# Patient Record
Sex: Female | Born: 1975 | Race: Black or African American | Hispanic: No | Marital: Single | Smoking: Never smoker
Health system: Southern US, Community
[De-identification: ages and names within clinical notes are randomized; demographics above are authoritative.]

## PROBLEM LIST (undated history)

## (undated) DIAGNOSIS — K219 Gastro-esophageal reflux disease without esophagitis: Secondary | ICD-10-CM

## (undated) DIAGNOSIS — I10 Essential (primary) hypertension: Secondary | ICD-10-CM

## (undated) HISTORY — DX: Essential (primary) hypertension: I10

## (undated) HISTORY — DX: Gastro-esophageal reflux disease without esophagitis: K21.9

---

## 2013-07-09 HISTORY — PX: LEEP: SHX91

## 2013-09-21 LAB — HM PAP SMEAR: HM Pap smear: ABNORMAL

## 2014-06-01 ENCOUNTER — Ambulatory Visit: Payer: Self-pay | Admitting: Obstetrics & Gynecology

## 2014-06-02 ENCOUNTER — Ambulatory Visit: Payer: Self-pay | Admitting: Obstetrics & Gynecology

## 2014-06-02 LAB — WBC: WBC: 7.4 10*3/uL (ref 3.6–11.0)

## 2014-06-02 LAB — HEMOGLOBIN: HGB: 14.2 g/dL (ref 12.0–16.0)

## 2014-06-10 ENCOUNTER — Ambulatory Visit: Payer: Self-pay | Admitting: Obstetrics & Gynecology

## 2014-09-07 HISTORY — PX: UPPER GI ENDOSCOPY: SHX6162

## 2014-09-15 ENCOUNTER — Ambulatory Visit: Payer: Self-pay | Admitting: Gastroenterology

## 2014-09-22 ENCOUNTER — Ambulatory Visit (INDEPENDENT_AMBULATORY_CARE_PROVIDER_SITE_OTHER): Payer: 59 | Admitting: Nurse Practitioner

## 2014-09-22 ENCOUNTER — Encounter (INDEPENDENT_AMBULATORY_CARE_PROVIDER_SITE_OTHER): Payer: Self-pay

## 2014-09-22 ENCOUNTER — Encounter: Payer: Self-pay | Admitting: Nurse Practitioner

## 2014-09-22 VITALS — BP 140/100 | HR 93 | Temp 99.2°F | Resp 14 | Ht 62.0 in | Wt 140.0 lb

## 2014-09-22 DIAGNOSIS — H8113 Benign paroxysmal vertigo, bilateral: Secondary | ICD-10-CM

## 2014-09-22 DIAGNOSIS — Z7689 Persons encountering health services in other specified circumstances: Secondary | ICD-10-CM

## 2014-09-22 DIAGNOSIS — Z7189 Other specified counseling: Secondary | ICD-10-CM

## 2014-09-22 DIAGNOSIS — K219 Gastro-esophageal reflux disease without esophagitis: Secondary | ICD-10-CM

## 2014-09-22 DIAGNOSIS — F411 Generalized anxiety disorder: Secondary | ICD-10-CM

## 2014-09-22 NOTE — Progress Notes (Signed)
Pre visit review using our clinic review tool, if applicable. No additional management support is needed unless otherwise documented below in the visit note. 

## 2014-09-22 NOTE — Progress Notes (Signed)
Subjective:    Patient ID: Margaret May, female    DOB: 07/05/1976, 39 y.o.   MRN: [id number]  HPI  [name] is a 39 yo female establishing care today. She has a CC of dizziness and esophageal burning.   1) Health Maintenance-   Diet- Eats out a lot, watches what she eats   Exercise- Treadmill for 20 min 3 days a week   Immunizations- Tdap- 5-6 years ago   Pap- [place], abnormal- went to [facility] Dr. Elesa Massed  Eye Exam- UTD  Dental Exam- UTD  2) Chronic Problems-  [facility]- 120/90 medication in the past   GERD- Nexium 1 when needed   3) Acute Problems-  Burning in throat when eating certain stuff. Last 2 months, seeing GI specialist, drinking more water   Dizziness like the room is spinning when moving from lying to standing quickly or moving head quickly.   Recent labs:  03/03/2014  TC 137  HDL 75  LDL 75  Triglycerides 46   Glucose 84  5.5 a1c  Review of Systems  Constitutional: Negative for fever, chills, diaphoresis and fatigue.  HENT: Negative for tinnitus and trouble swallowing.   Eyes: Negative for visual disturbance.  Respiratory: Negative for chest tightness, shortness of breath and wheezing.   Cardiovascular: Negative for chest pain, palpitations and leg swelling.  Gastrointestinal: Positive for constipation. Negative for nausea, vomiting and diarrhea.       Intermittently  Endocrine: Negative for polydipsia, polyphagia and polyuria.  Genitourinary: Negative for dysuria.  Skin: Negative for rash.  Neurological: Positive for dizziness. Negative for weakness, numbness and headaches.  Psychiatric/Behavioral: Positive for sleep disturbance. Negative for suicidal ideas. The patient is nervous/anxious.    Past Medical History  Diagnosis Date  . GERD (gastroesophageal reflux disease)   . Hypertension     History   Social History  . Marital Status: Single    Spouse Name: N/A  . Number of Children: N/A  . Years of  Education: N/A   Occupational History  . Not on file.   Social History Main Topics  . Smoking status: Never Smoker   . Smokeless tobacco: Never Used  . Alcohol Use: No  . Drug Use: No  . Sexual Activity: Not Currently   Other Topics Concern  . 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 Upper gi endoscopy  09/2014  . Leep  2015    Family History  Problem Relation Age of Onset  . Hypertension Mother   . Kidney disease Maternal Uncle   . Diabetes Paternal Aunt   . Arthritis Maternal Grandmother   . Hypertension Maternal Grandmother     No Known Allergies  No current outpatient prescriptions on file prior to visit.   No current facility-administered medications on file prior to visit.      Objective:   Physical Exam  Constitutional: She is oriented to person, place, and time. She appears well-developed and well-nourished. No distress.  BP 140/100 mmHg  Pulse 93  Temp(Src) 99.2 F (37.3 C) (Oral)  Resp 14  Ht  (1.575 m)  Wt 140 lb (63.504 kg)  BMI 25.60 kg/m2  SpO2  97%  LMP  (Approximate) Repeat BP 126/92, temp- 98.2   HENT:  Head: Normocephalic and atraumatic.  Right Ear: External ear normal.  Left Ear: External ear normal.  Eyes: Right eye exhibits no discharge. Left eye exhibits no discharge. No scleral icterus.  Neck: Normal range of motion. Neck supple.  Cardiovascular: Normal rate, regular rhythm, normal heart sounds and intact distal pulses.  Exam reveals no gallop and no friction rub.   No murmur heard. Pulmonary/Chest: Effort normal and breath sounds normal. No respiratory distress. She has no wheezes. She has no rales. She exhibits no tenderness.  Neurological: She is alert and oriented to person, place, and  time. No cranial nerve deficit. She exhibits normal muscle tone. Coordination normal.  Skin: Skin is warm and dry. No rash noted. She is not diaphoretic.  Psychiatric: She has a normal mood and affect. Her behavior is normal. Judgment and thought content normal.  Very nervous at the beginning and states it is the white coat. After removing the coat and talking to her for awhile she was much more at ease and vital signs improved.       Assessment & Plan:

## 2014-09-22 NOTE — Patient Instructions (Signed)
Welcome to [facility]! 

## 2014-09-26 DIAGNOSIS — H8110 Benign paroxysmal vertigo, unspecified ear: Secondary | ICD-10-CM | POA: Insufficient documentation

## 2014-09-26 DIAGNOSIS — Z7689 Persons encountering health services in other specified circumstances: Secondary | ICD-10-CM | POA: Insufficient documentation

## 2014-09-26 DIAGNOSIS — K219 Gastro-esophageal reflux disease without esophagitis: Secondary | ICD-10-CM | POA: Insufficient documentation

## 2014-09-26 DIAGNOSIS — F411 Generalized anxiety disorder: Secondary | ICD-10-CM | POA: Insufficient documentation

## 2014-09-26 NOTE — Assessment & Plan Note (Signed)
Very anxious about medical visits. Responded well to removing white coat. Will keep this in mind for future visits. On diazepam.

## 2014-09-26 NOTE — Assessment & Plan Note (Signed)
Asked pt to make sure she is slowly changing positions and to not turn head quickly if she can be mindful of this.

## 2014-09-26 NOTE — Assessment & Plan Note (Signed)
Discussed acute and chronic issues. Reviewed health maintenance measures, PFSHx, and immunizations.   

## 2014-09-26 NOTE — Assessment & Plan Note (Signed)
Seeing GI for possible endoscopy. Nexium helpful. Drinking more water. Continue these measures and fu with GI.

## 2014-10-30 NOTE — Op Note (Signed)
PATIENT NAME:  [name], Margaret A MR#:  [id number] DATE OF BIRTH:  February 05, 1976  DATE OF PROCEDURE:  06/10/2014  PREOPERATIVE DIAGNOSIS:  Cervical dysplasia.    POSTOPERATIVE DIAGNOSIS:  Cervical dysplasia.    PROCEDURE:  Loop electrosurgical excision procedure cervical biopsy.    SURGEON:  Ranae Plumberhelsea Batul Diego, May.    ASSISTANT:  None.    ANESTHESIA:  General/LMA.    INTRAVENOUS FLUIDS IN:  800 mL lactated Ringer's.    URINE OUTPUT:  0.    ESTIMATED BLOOD LOSS:  5 mL.    SPECIMENS:   1.  Loop cervical biopsy.   2.  Endocervical curettings.    FINDINGS:  Normal-appearing cervix.    COMPLICATIONS:  None apparent.    DISPOSITION:  Stable to PACU for recovery.    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  Due to the patient's intolerance of the colposcopy she was brought to the OR for a cervical biopsy.    OPERATIVE NOTE:  [name], brought back to the operating room where she was given general anesthesia via LMA.  She was placed in the dorsal lithotomy position with candy cane stirrups and vaginally prepped with Betadine.  A surgical timeout was called.  She was prepped and draped in the usual sterile manner and a cautery-safe Peterson speculum was placed into the vagina.  The cervix was visualized and bathed in Lugol solution.  At the time of procedure there were no areas of absent uptake of Lugol solution.  There were no areas identified at 5 o'clock as was previously noted on the abandoned colposcopy.  A small shallow electrocautery loop was selected and with cut at 60 and cautery at 1. The loop tip was entered just posterior to the cervical os, carried anterior for a 1 x 1 cm excision biopsy with the center being the cervical os.  This was tagged at 12 o'clock and handed off to nursing for it to be sent  to pathology.  Following this gentle endocervical curettage was performed and endocervical cells were handed off to nursing to be sent to pathology.  The roller ball cautery tip was then used with 40-40 cut/cautery to cauterize the excision area.  There was no bleeding noted and at the end of the procedure 10 mL of lidocaine, 5 mL at 4 o'clock and 5 mL at 8 o'clock were injected into the paracervical area for decreased uterine cramping and patient comfort following the procedure.  That was the end of the procedure.  All instruments were removed, the counts were correct x 2, and the patient was brought to PACU in stable condition for recovery.     ____________________________ Margaret Fenderhelsea C. Daton Szilagyi, May ccw:bu D: 06/10/2014 09:27:02 ET T: 06/10/2014 14:06:17 ET JOB#: [id number]  cc: Margaret Bockhelsea C. Remijio Holleran, May, <Dictator> Margaret May ELECTRONICALLY SIGNED 06/10/2014 18:18

## 2014-11-01 LAB — SURGICAL PATHOLOGY

## 2014-12-01 ENCOUNTER — Ambulatory Visit (INDEPENDENT_AMBULATORY_CARE_PROVIDER_SITE_OTHER): Payer: 59 | Admitting: Nurse Practitioner

## 2014-12-01 VITALS — BP 142/100 | HR 85 | Temp 98.4°F | Resp 16 | Ht 62.0 in | Wt 135.4 lb

## 2014-12-01 DIAGNOSIS — R350 Frequency of micturition: Secondary | ICD-10-CM

## 2014-12-01 LAB — POCT URINALYSIS DIPSTICK
Bilirubin, UA: NEGATIVE
Glucose, UA: NEGATIVE
KETONES UA: NEGATIVE
Leukocytes, UA: NEGATIVE
NITRITE UA: NEGATIVE
Protein, UA: NEGATIVE
Spec Grav, UA: 1.01
UROBILINOGEN UA: 0.2
pH, UA: 6

## 2014-12-01 MED ORDER — AMLODIPINE BESYLATE 5 MG PO TABS: 5.0000 mg | ORAL_TABLET | Freq: Every day | ORAL | Status: DC

## 2014-12-01 NOTE — Patient Instructions (Signed)
Try Advil and/or AZO over the counter   Follow up in 1 month.

## 2014-12-01 NOTE — Progress Notes (Signed)
   Subjective:    Patient ID: Margaret May, female    DOB: 02/08/1976, 39 y.o.   MRN: [id number]  HPI  Margaret May is a 39 yo female with a CC of urinary frequency and discomfort of lower abdomen/pelvis x 3 days.   1) Urinating, drinking more water than usual   Coke and tea mainly previously Denies odor or discharge   Achy L>R-  Next period due 12/20/14   Advil- helped with sinus pressure this weekend  Review of Systems  Constitutional: Negative for fever, chills, diaphoresis and fatigue.  Gastrointestinal: Negative for nausea and vomiting.  Genitourinary: Positive for frequency and pelvic pain. Negative for dysuria, urgency, hematuria, flank pain, decreased urine volume, vaginal bleeding, vaginal discharge and difficulty urinating.  Skin: Negative for rash.      Objective:   Physical Exam  Constitutional: She is oriented to person, place, and time. She appears well-developed and well-nourished. No distress.  BP 142/100 mmHg  Pulse 85  Temp(Src) 98.4 F (36.9 C)  Resp 16  Ht 5\' 2"  (1.575 m)  Wt 135 lb 6.4 oz (61.417 kg)  BMI 24.76 kg/m2  SpO2 99%   HENT:  Head: Normocephalic and atraumatic.  Right Ear: External ear normal.  Left Ear: External ear normal.  Cardiovascular: Normal rate, regular rhythm and normal heart sounds.  Exam reveals no gallop and no friction rub.   No murmur heard. Pulmonary/Chest: Effort normal and breath sounds normal. No respiratory distress. She has no wheezes. She has no rales. She exhibits no tenderness.  Abdominal: There is no CVA tenderness.  Neurological: She is alert and oriented to person, place, and time. No cranial nerve deficit. She exhibits normal muscle tone. Coordination normal.  Skin: Skin is warm and dry. No rash noted. She is not diaphoretic.  Psychiatric: She has a normal mood and affect. Her behavior is normal. Judgment and thought content normal.          Assessment & Plan:

## 2014-12-03 LAB — URINE CULTURE: Colony Count: 50000

## 2014-12-16 ENCOUNTER — Encounter: Payer: Self-pay | Admitting: Nurse Practitioner

## 2014-12-16 DIAGNOSIS — R350 Frequency of micturition: Secondary | ICD-10-CM | POA: Insufficient documentation

## 2014-12-16 NOTE — Assessment & Plan Note (Signed)
Obtain POCT urine. Asked pt to try Advil and/or Azo OTC. Will obtain culture and follow up in 1 month.

## 2015-01-05 ENCOUNTER — Ambulatory Visit (INDEPENDENT_AMBULATORY_CARE_PROVIDER_SITE_OTHER): Payer: 59 | Admitting: *Deleted

## 2015-01-05 ENCOUNTER — Other Ambulatory Visit: Payer: Self-pay | Admitting: Nurse Practitioner

## 2015-01-05 DIAGNOSIS — I10 Essential (primary) hypertension: Secondary | ICD-10-CM | POA: Diagnosis not present

## 2015-01-05 NOTE — Progress Notes (Signed)
Pt presents for BP check, was to have been scheduled with Lyla Sonarrie, but accidentally scheduled as nurse visit. Lyla SonCarrie made aware, recommended pt have BP/HR check and labs today, and schedule follow up visit soon. Pt verbalized understanding. Pt has been taking Amlodipine 5 mg daily since 12/02/14, no problems or concerns today. Has not been checking BP at home. No problems with taking medication. See BP and HR. Appt scheduled with Lyla Sonarrie 01/13/15.

## 2015-01-06 LAB — COMPREHENSIVE METABOLIC PANEL
ALK PHOS: 70 U/L (ref 39–117)
ALT: 7 U/L (ref 0–35)
AST: 15 U/L (ref 0–37)
Albumin: 4.5 g/dL (ref 3.5–5.2)
BILIRUBIN TOTAL: 0.3 mg/dL (ref 0.2–1.2)
BUN: 10 mg/dL (ref 6–23)
CO2: 29 mEq/L (ref 19–32)
Calcium: 9.5 mg/dL (ref 8.4–10.5)
Chloride: 103 mEq/L (ref 96–112)
Creatinine, Ser: 0.88 mg/dL (ref 0.40–1.20)
GFR: 91.46 mL/min (ref >60.00)
Glucose, Bld: 85 mg/dL (ref 70–99)
Potassium: 3.3 mEq/L — ABNORMAL LOW (ref 3.5–5.1)
SODIUM: 138 meq/L (ref 135–145)
Total Protein: 8.2 g/dL (ref 6.0–8.3)

## 2015-01-07 NOTE — Progress Notes (Deleted)
   Subjective:    Patient ID: Margaret May, female    DOB: 12/07/1975, 39 y.o.   MRN: [id number]  HPI    Review of Systems     Objective:   Physical Exam        Assessment & Plan:

## 2015-01-07 NOTE — Progress Notes (Addendum)
Patient ID: Margaret May, female   DOB: 11/20/1975, 39 y.o.   MRN: [id number]  BP 130/98 mmHg  Pulse 83  Will up to Amlodipine 10 mg at next visit and get BMET. I have personally reviewed R. Fraser DinNixon, RN's note and agree with content. Follow up scheduled.   [name], NP- C 01/07/15 12:49

## 2015-01-11 ENCOUNTER — Other Ambulatory Visit: Payer: Self-pay | Admitting: Nurse Practitioner

## 2015-01-11 DIAGNOSIS — E876 Hypokalemia: Secondary | ICD-10-CM

## 2015-01-13 ENCOUNTER — Ambulatory Visit (INDEPENDENT_AMBULATORY_CARE_PROVIDER_SITE_OTHER): Payer: 59 | Admitting: Nurse Practitioner

## 2015-01-13 ENCOUNTER — Encounter (INDEPENDENT_AMBULATORY_CARE_PROVIDER_SITE_OTHER): Payer: Self-pay

## 2015-01-13 VITALS — BP 139/88 | HR 85 | Temp 98.3°F | Resp 16 | Ht 62.0 in | Wt 138.6 lb

## 2015-01-13 DIAGNOSIS — E876 Hypokalemia: Secondary | ICD-10-CM

## 2015-01-13 DIAGNOSIS — I10 Essential (primary) hypertension: Secondary | ICD-10-CM

## 2015-01-13 MED ORDER — AMLODIPINE BESYLATE 10 MG PO TABS: 10.0000 mg | ORAL_TABLET | Freq: Every day | ORAL | Status: DC

## 2015-01-13 NOTE — Progress Notes (Signed)
Pre visit review using our clinic review tool, if applicable. No additional management support is needed unless otherwise documented below in the visit note. 

## 2015-01-13 NOTE — Patient Instructions (Signed)
See you in 4 weeks for re-check.

## 2015-01-13 NOTE — Progress Notes (Signed)
   Subjective:    Patient ID: Margaret May, female    DOB: 12/06/1975, 39 y.o.   MRN: [id number]  HPI  [name] is a 39 yo female with a follow up of her HTN.   1) Headache today- no headaches in awhile, location- points to temples, feels like pressure, mild. Does not check BP at home.    Review of Systems  Constitutional: Negative for fever, chills, diaphoresis and fatigue.  Respiratory: Negative for chest tightness, shortness of breath and wheezing.   Cardiovascular: Negative for chest pain, palpitations and leg swelling.  Neurological: Positive for headaches.  Psychiatric/Behavioral: The patient is nervous/anxious.       Objective:   Physical Exam  Constitutional: She is oriented to person, place, and time. She appears well-developed and well-nourished. No distress.  BP 139/88 mmHg  Pulse 85  Temp(Src) 98.3 F (36.8 C)  Resp 16  Ht 5\' 2"  (1.575 m)  Wt 138 lb 9.6 oz (62.869 kg)  BMI 25.34 kg/m2  SpO2 98%   Cardiovascular: Normal rate and regular rhythm.   Pulmonary/Chest: Effort normal and breath sounds normal.  Neurological: She is alert and oriented to person, place, and time. No cranial nerve deficit. She exhibits normal muscle tone. Coordination normal.  Skin: Skin is warm and dry. No rash noted. She is not diaphoretic.  Psychiatric: She has a normal mood and affect. Her behavior is normal. Judgment and thought content normal.      Assessment & Plan:

## 2015-01-14 LAB — POTASSIUM: Potassium: 3.4 mEq/L — ABNORMAL LOW (ref 3.5–5.1)

## 2015-01-17 ENCOUNTER — Encounter: Payer: Self-pay | Admitting: Nurse Practitioner

## 2015-01-17 LAB — LIPID PANEL
Cholesterol: 137 mg/dL (ref 0–200)
HDL: 54 mg/dL (ref 35–70)
LDL Cholesterol: 75 mg/dL
TRIGLYCERIDES: 39 mg/dL — AB (ref 40–160)

## 2015-01-17 LAB — HEMOGLOBIN A1C: Hgb A1c MFr Bld: 5.7 % (ref 4.0–6.0)

## 2015-01-26 ENCOUNTER — Encounter: Payer: Self-pay | Admitting: Nurse Practitioner

## 2015-01-26 DIAGNOSIS — I10 Essential (primary) hypertension: Secondary | ICD-10-CM | POA: Insufficient documentation

## 2015-01-26 DIAGNOSIS — E876 Hypokalemia: Secondary | ICD-10-CM | POA: Insufficient documentation

## 2015-01-26 NOTE — Assessment & Plan Note (Signed)
BP Readings from Last 3 Encounters:  01/13/15 139/88  01/05/15 130/98  12/01/14 142/100   Will check potassium. Refilled medication.

## 2015-01-26 NOTE — Assessment & Plan Note (Signed)
Potassium 3.3 in June. Will recheck today to see if it is trending up or down. Will follow.

## 2015-01-28 ENCOUNTER — Telehealth: Payer: Self-pay | Admitting: Nurse Practitioner

## 2015-01-28 NOTE — Telephone Encounter (Signed)
Pt would like a doctors note for a space heater. She states that she has allergy conditions and when it gets to cold she gets sick and needs a space heater to keep warm. She had a note, but was dated 2005 stating this. Please advise.

## 2015-01-28 NOTE — Telephone Encounter (Signed)
Pt called to request a  Doctors note to have her own space at work due to allergies. Please call pt/msn

## 2015-01-31 ENCOUNTER — Encounter: Payer: Self-pay | Admitting: Nurse Practitioner

## 2015-01-31 NOTE — Telephone Encounter (Signed)
Called and informed pt that letter was ready to be picked up and I would leave it up front for her. Pt also stated that someone from HR would be calling with questions about the letter and the request for the heater.

## 2015-01-31 NOTE — Telephone Encounter (Signed)
Pt can pick up note.

## 2015-02-10 ENCOUNTER — Other Ambulatory Visit: Payer: Self-pay

## 2015-02-10 ENCOUNTER — Ambulatory Visit (INDEPENDENT_AMBULATORY_CARE_PROVIDER_SITE_OTHER): Payer: 59

## 2015-02-10 VITALS — BP 118/72

## 2015-02-10 DIAGNOSIS — I10 Essential (primary) hypertension: Secondary | ICD-10-CM | POA: Diagnosis not present

## 2015-02-10 MED ORDER — RANITIDINE HCL 150 MG PO TABS: 150.0000 mg | ORAL_TABLET | ORAL | Status: DC | PRN

## 2015-02-10 MED ORDER — AMLODIPINE BESYLATE 10 MG PO TABS: 10.0000 mg | ORAL_TABLET | Freq: Every day | ORAL | Status: DC

## 2015-02-10 NOTE — Progress Notes (Signed)
Patient came in for BP check.  States that she is taking her medications as prescribed since her last visit.  BP checked and it was 118/72.  Patient gave me copies of [facility] evaluation for  NP's review.  Has new paperwork for work accommodation, left in C.Doss folder.

## 2015-02-24 NOTE — Progress Notes (Signed)
I have personally reviewed the note by T. Tommie Ard and agree.  Naomie Dean, AGNP-C

## 2015-03-02 ENCOUNTER — Telehealth: Payer: Self-pay | Admitting: Nurse Practitioner

## 2015-03-02 NOTE — Telephone Encounter (Signed)
Corlene from [employer] called regarding pt to inquire what type of accomodation is needed. Ayianna number is [phone]. Thank You!

## 2015-03-08 NOTE — Telephone Encounter (Signed)
Talked to representative about letter written for pt. Needed more information on why she needed accommodations for a heater.

## 2015-03-10 ENCOUNTER — Encounter (INDEPENDENT_AMBULATORY_CARE_PROVIDER_SITE_OTHER): Payer: Self-pay

## 2015-03-10 ENCOUNTER — Ambulatory Visit (INDEPENDENT_AMBULATORY_CARE_PROVIDER_SITE_OTHER): Payer: 59 | Admitting: Nurse Practitioner

## 2015-03-10 VITALS — BP 140/84 | HR 79 | Temp 98.9°F | Resp 14 | Ht 62.0 in | Wt 128.4 lb

## 2015-03-10 DIAGNOSIS — R002 Palpitations: Secondary | ICD-10-CM

## 2015-03-10 MED ORDER — DIAZEPAM 5 MG PO TABS: 5.0000 mg | ORAL_TABLET | ORAL | Status: DC | PRN

## 2015-03-10 NOTE — Progress Notes (Signed)
Pre visit review using our clinic review tool, if applicable. No additional management support is needed unless otherwise documented below in the visit note. 

## 2015-03-10 NOTE — Progress Notes (Signed)
Patient ID: Margaret May, female    DOB: 05-15-1976  Age: 39 y.o. MRN: [id number]  CC: Breast Pain   HPI Margaret May presents for pain and pressure under left breast x 4 days.  1) patient reports it started on Monday after receiving some terrible news. Patient is visibly anxious today. Did not discuss wrist any further. Ate fried zucchini- pain began after this Took nexium- helped somewhat  Fluttering and pressure on left side of chest beneath breast   Patient reports she is very stressed and has a lot of family issues happening at this time  History Margaret May has a past medical history of GERD (gastroesophageal reflux disease) and Hypertension.   She has past surgical history that includes Upper gi endoscopy (09/2014) and LEEP (2015).   Her family history includes Arthritis in her maternal grandmother; Diabetes in her paternal aunt; Hypertension in her maternal grandmother and mother; Kidney disease in her maternal uncle.She reports that she has never smoked. She has never used smokeless tobacco. She reports that she does not drink alcohol or use illicit drugs.  Outpatient Prescriptions Prior to Visit  Medication Sig Dispense Refill  . amLODipine (NORVASC) 10 MG tablet Take 1 tablet (10 mg total) by mouth daily. 90 tablet 1  . fluticasone (FLONASE) 50 MCG/ACT nasal spray Place 1 spray into both nostrils every other day.    . ibuprofen (ADVIL,MOTRIN) 800 MG tablet Take 800 mg by mouth every 8 (eight) hours as needed.    . Naproxen Sodium (ALEVE PO) Take 1 tablet by mouth as needed.    . polyethylene glycol powder (GLYCOLAX/MIRALAX) powder Take 1 Container by mouth once.    . Probiotic Product (PROBIOTIC DAILY PO) Take 1 capsule by mouth daily.    . ranitidine (ZANTAC) 150 MG tablet Take 1 tablet (150 mg total) by mouth as needed for heartburn. 90 tablet 1  . diazepam (VALIUM) 5 MG tablet Take 5 mg by mouth as needed for anxiety.     No  facility-administered medications prior to visit.    ROS Review of Systems  Constitutional: Negative for fever, chills, diaphoresis and fatigue.  Respiratory: Negative for chest tightness, shortness of breath and wheezing.   Cardiovascular: Positive for chest pain and palpitations. Negative for leg swelling.  Gastrointestinal: Negative for nausea, vomiting and diarrhea.  Skin: Negative for rash.  Neurological: Negative for dizziness, weakness, numbness and headaches.  Psychiatric/Behavioral: The patient is nervous/anxious.     Objective:  BP 140/84 mmHg  Pulse 79  Temp(Src) 98.9 F (37.2 C)  Resp 14  Ht  (1.575 m)  Wt 128 lb 6.4 oz (58.242 kg)  BMI 23.48 kg/m2  SpO2 97%  Physical Exam  Constitutional: She is oriented to person, place, and time. She appears well-developed and well-nourished. No distress.  HENT:  Head: Normocephalic and atraumatic.  Right Ear: External ear normal.  Left Ear: External ear normal.  Cardiovascular: Normal rate, regular rhythm, normal heart sounds and intact distal pulses.  Exam reveals no gallop and no friction rub.   No murmur heard. Pulmonary/Chest: Effort normal and breath sounds normal. No respiratory distress. She has no wheezes. She has no rales. She exhibits no tenderness.  Neurological: She is alert and oriented to person, place, and time. No cranial nerve deficit. She exhibits normal muscle tone. Coordination normal.  Skin: Skin is warm and dry. No rash noted. She is not diaphoretic.  Psychiatric: She has a normal mood and affect. Her behavior is normal.  Judgment and thought content normal.  Patient is tearful today. Patient is visibly anxious   Assessment & Plan:   Sativa was seen today for breast pain.  Diagnoses and all orders for this visit:  Palpitations -     EKG 12-Lead  Other orders -     diazepam (VALIUM) 5 MG tablet; Take 1 tablet (5 mg total) by mouth as needed for anxiety.   I have changed Margaret May's  diazepam. I am also having her maintain her fluticasone, Probiotic Product (PROBIOTIC DAILY PO), polyethylene glycol powder, Naproxen Sodium (ALEVE PO), ibuprofen, ranitidine, and amLODipine.  Meds ordered this encounter  Medications  . diazepam (VALIUM) 5 MG tablet    Sig: Take 1 tablet (5 mg total) by mouth as needed for anxiety.    Dispense:  30 tablet    Refill:  0    Order Specific Question:  Supervising Provider    Answer:  Sherlene Shams [[id number]]     Follow-up: Return if symptoms worsen or fail to improve.

## 2015-03-15 ENCOUNTER — Encounter: Payer: Self-pay | Admitting: Nurse Practitioner

## 2015-03-15 DIAGNOSIS — R002 Palpitations: Secondary | ICD-10-CM | POA: Insufficient documentation

## 2015-03-15 NOTE — Assessment & Plan Note (Signed)
Uncontrolled. Believe this to be a combination of palpitations and stress. EKG shows normal sinus rhythm. Discussed with patient and she seemed happy that her heart is okay but distressed that we could not find any problems. Gave patient one month of Valium and asked her to take at nighttime. Discussed not driving or using heavy machinery with use. Asked patient to find support and discuss de-stressing activities. Will follow

## 2015-04-25 ENCOUNTER — Telehealth: Payer: Self-pay | Admitting: Nurse Practitioner

## 2015-04-25 NOTE — Telephone Encounter (Signed)
Pt would like to get lab work done before 10/25 appt.  Orders are needed please and thank you. Pt will pick up the [facility] request form. Thank you!

## 2015-04-26 ENCOUNTER — Other Ambulatory Visit: Payer: Self-pay | Admitting: Nurse Practitioner

## 2015-04-26 NOTE — Telephone Encounter (Signed)
It is ready for pick-up. I will have melanie place at the front desk if you could call her and tell her to pick up the form. Thanks!

## 2015-04-27 ENCOUNTER — Other Ambulatory Visit: Payer: Self-pay | Admitting: Nurse Practitioner

## 2015-04-28 LAB — COMPREHENSIVE METABOLIC PANEL
ALK PHOS: 68 IU/L (ref 39–117)
ALT: 8 IU/L (ref 0–32)
AST: 13 IU/L (ref 0–40)
Albumin/Globulin Ratio: 1.6 (ref 1.1–2.5)
Albumin: 4.5 g/dL (ref 3.5–5.5)
BILIRUBIN TOTAL: 0.4 mg/dL (ref 0.0–1.2)
BUN/Creatinine Ratio: 12 (ref 9–23)
BUN: 10 mg/dL (ref 6–24)
CO2: 25 mmol/L (ref 18–29)
Calcium: 9.8 mg/dL (ref 8.7–10.2)
Chloride: 101 mmol/L (ref 97–106)
Creatinine, Ser: 0.85 mg/dL (ref 0.57–1.00)
GFR calc non Af Amer: 86 mL/min/{1.73_m2} (ref >59)
GFR, EST AFRICAN AMERICAN: 99 mL/min/{1.73_m2} (ref >59)
Globulin, Total: 2.9 g/dL (ref 1.5–4.5)
Glucose: 85 mg/dL (ref 65–99)
POTASSIUM: 4 mmol/L (ref 3.5–5.2)
SODIUM: 141 mmol/L (ref 136–144)
Total Protein: 7.4 g/dL (ref 6.0–8.5)

## 2015-04-28 LAB — CBC WITH DIFFERENTIAL/PLATELET
BASOS: 0 %
Basophils Absolute: 0 10*3/uL (ref 0.0–0.2)
EOS (ABSOLUTE): 0.1 10*3/uL (ref 0.0–0.4)
EOS: 1 %
HEMOGLOBIN: 14.3 g/dL (ref 11.1–15.9)
Hematocrit: 41.4 % (ref 34.0–46.6)
Immature Grans (Abs): 0 10*3/uL (ref 0.0–0.1)
Immature Granulocytes: 0 %
Lymphocytes Absolute: 2.8 10*3/uL (ref 0.7–3.1)
Lymphs: 52 %
MCH: 27.6 pg (ref 26.6–33.0)
MCHC: 34.5 g/dL (ref 31.5–35.7)
MCV: 80 fL (ref 79–97)
MONOCYTES: 5 %
Monocytes Absolute: 0.3 10*3/uL (ref 0.1–0.9)
NEUTROS ABS: 2.3 10*3/uL (ref 1.4–7.0)
Neutrophils: 42 %
Platelets: 372 10*3/uL (ref 150–379)
RBC: 5.18 x10E6/uL (ref 3.77–5.28)
RDW: 13.8 % (ref 12.3–15.4)
WBC: 5.4 10*3/uL (ref 3.4–10.8)

## 2015-05-03 ENCOUNTER — Ambulatory Visit (INDEPENDENT_AMBULATORY_CARE_PROVIDER_SITE_OTHER): Payer: 59 | Admitting: Nurse Practitioner

## 2015-05-03 VITALS — BP 135/95 | HR 86 | Temp 99.5°F | Resp 14 | Ht 62.0 in | Wt 136.0 lb

## 2015-05-03 DIAGNOSIS — Z418 Encounter for other procedures for purposes other than remedying health state: Secondary | ICD-10-CM

## 2015-05-03 DIAGNOSIS — Z23 Encounter for immunization: Secondary | ICD-10-CM

## 2015-05-03 DIAGNOSIS — Z299 Encounter for prophylactic measures, unspecified: Secondary | ICD-10-CM

## 2015-05-03 DIAGNOSIS — Z0000 Encounter for general adult medical examination without abnormal findings: Secondary | ICD-10-CM

## 2015-05-03 NOTE — Progress Notes (Signed)
Patient ID: Margaret May, female    DOB: 08/31/1975  Age: 39 y.o. MRN: [id number]  CC: Annual Exam   HPI Margaret May presents for annual exam.  1) Health Maintenance-   Diet- no change since last visit  Exercise- no change since last visit  Immunizations- she refuses flu, but would like Td booster today.  Mammogram- declines today  Pap- due in 2018  Eye Exam- UTD  Dental Exam- UTD  Denies needing refills today  no acute concerns  History Margaret May has a past medical history of GERD (gastroesophageal reflux disease) and Hypertension.   She has past surgical history that includes Upper gi endoscopy (09/2014) and LEEP (2015).   Her family history includes Arthritis in her maternal grandmother; Diabetes in her paternal aunt; Hypertension in her maternal grandmother and mother; Kidney disease in her maternal uncle.She reports that she has never smoked. She has never used smokeless tobacco. She reports that she does not drink alcohol or use illicit drugs.  Outpatient Prescriptions Prior to Visit  Medication Sig Dispense Refill  . amLODipine (NORVASC) 10 MG tablet Take 1 tablet (10 mg total) by mouth daily. 90 tablet 1  . diazepam (VALIUM) 5 MG tablet Take 1 tablet (5 mg total) by mouth as needed for anxiety. 30 tablet 0  . fluticasone (FLONASE) 50 MCG/ACT nasal spray Place 1 spray into both nostrils every other day.    . ibuprofen (ADVIL,MOTRIN) 800 MG tablet Take 800 mg by mouth every 8 (eight) hours as needed.    . Naproxen Sodium (ALEVE PO) Take 1 tablet by mouth as needed.    . polyethylene glycol powder (GLYCOLAX/MIRALAX) powder Take 1 Container by mouth once.    . Probiotic Product (PROBIOTIC DAILY PO) Take 1 capsule by mouth daily.    .  ranitidine (ZANTAC) 150 MG tablet Take 1 tablet (150 mg total) by mouth as needed for heartburn. 90 tablet 1   No facility-administered medications prior to visit.    ROS Review of Systems  Constitutional: Negative for fever, chills, diaphoresis, fatigue and unexpected weight change.  HENT: Negative for tinnitus and trouble swallowing.   Gastrointestinal: Negative for nausea, vomiting, abdominal pain, diarrhea, constipation and blood in stool.  Endocrine: Negative for polydipsia, polyphagia and polyuria.  Genitourinary: Negative for dysuria, hematuria, vaginal discharge and vaginal pain.  Musculoskeletal: Negative for myalgias, back pain, arthralgias and gait problem.  Skin: Negative for color change and rash.  Neurological: Negative for dizziness, weakness, numbness and headaches.  Hematological: Does not bruise/bleed easily.  Psychiatric/Behavioral: Negative for suicidal ideas and sleep disturbance. The patient is nervous/anxious.     Objective:  BP 135/95 mmHg  Pulse 86  Temp(Src) 99.5 F (37.5 C)  Resp 14  Ht 5\' 2"  (1.575 m)  Wt 136 lb (61.689 kg)  BMI 24.87 kg/m2  SpO2 100%  Physical Exam  Constitutional: She is oriented to person, place, and time. She appears well-developed and well-nourished. No distress.  HENT:  Head: Normocephalic and atraumatic.  Right Ear: External ear normal.  Left Ear: External ear normal.  Nose: Nose normal.  Mouth/Throat: Oropharynx is clear and moist. No oropharyngeal exudate.  TMs and canals clear bilaterally  Eyes: Conjunctivae and EOM are normal. Pupils are equal, round, and reactive to light. Right eye exhibits no discharge. Left eye exhibits no discharge. No scleral icterus.  Neck: Normal range of motion. Neck supple. No thyromegaly present.  Cardiovascular: Normal rate, regular rhythm, normal heart sounds and intact distal pulses.  Exam reveals no gallop and no friction rub.   No murmur heard. Pulmonary/Chest: Effort normal and breath  sounds normal. No respiratory distress. She has no wheezes. She has no rales. She exhibits no tenderness.  Clinical  breast exam performed today without significant findings  Abdominal: Soft. Bowel sounds are normal. She exhibits no distension and no mass. There is no tenderness. There is no rebound and no guarding.  Genitourinary:  Deferred due to lack of symptoms PAP Due in 2018  Musculoskeletal: Normal range of motion. She exhibits no edema or tenderness.  Lymphadenopathy:    She has no cervical adenopathy.  Neurological: She is alert and oriented to person, place, and time. She has normal reflexes. No cranial nerve deficit. She exhibits normal muscle tone. Coordination normal.  Skin: Skin is warm and dry. No rash noted. She is not diaphoretic. No erythema. No pallor.  Psychiatric: She has a normal mood and affect. Her behavior is normal. Judgment and thought content normal.      Assessment & Plan:   Margaret May was seen today for annual exam.  Diagnoses and all orders for this visit:  Need for prophylactic measure -     Td : Tetanus/diphtheria >7yo Preservative  free  Routine general medical examination at a health care facility  Other orders -     Cancel: Tdap vaccine greater than or equal to 7yo IM   I am having Margaret May maintain her fluticasone, Probiotic Product (PROBIOTIC DAILY PO), polyethylene glycol powder, Naproxen Sodium (ALEVE PO), ibuprofen, ranitidine, amLODipine, and diazepam.  No orders of the defined types were placed in this encounter.     Follow-up: Return in about 1 year (around 05/02/2016) for CPE with fasting labs.

## 2015-05-03 NOTE — Progress Notes (Signed)
Pre visit review using our clinic review tool, if applicable. No additional management support is needed unless otherwise documented below in the visit note. 

## 2015-05-03 NOTE — Patient Instructions (Signed)
Health Maintenance, Female Adopting a healthy lifestyle and getting preventive care can go a long way to promote health and wellness. Talk with your health care provider about what schedule of regular examinations is right for you. This is a good chance for you to check in with your provider about disease prevention and staying healthy. In between checkups, there are plenty of things you can do on your own. Experts have done a lot of research about which lifestyle changes and preventive measures are most likely to keep you healthy. Ask your health care provider for more information. WEIGHT AND DIET  Eat a healthy diet  Be sure to include plenty of vegetables, fruits, low-fat dairy products, and lean protein.  Do not eat a lot of foods high in solid fats, added sugars, or salt.  Get regular exercise. This is one of the most important things you can do for your health.  Most adults should exercise for at least 150 minutes each week. The exercise should increase your heart rate and make you sweat (moderate-intensity exercise).  Most adults should also do strengthening exercises at least twice a week. This is in addition to the moderate-intensity exercise.  Maintain a healthy weight  Body mass index (BMI) is a measurement that can be used to identify possible weight problems. It estimates body fat based on height and weight. Your health care provider can help determine your BMI and help you achieve or maintain a healthy weight.  For females 20 years of age and older:   A BMI below 18.5 is considered underweight.  A BMI of 18.5 to 24.9 is normal.  A BMI of 25 to 29.9 is considered overweight.  A BMI of 30 and above is considered obese.  Watch levels of cholesterol and blood lipids  You should start having your blood tested for lipids and cholesterol at 40 years of age, then have this test every 5 years.  You may need to have your cholesterol levels checked more often if:  Your lipid  or cholesterol levels are high.  You are older than 40 years of age.  You are at high risk for heart disease.  CANCER SCREENING   Lung Cancer  Lung cancer screening is recommended for adults 55-80 years old who are at high risk for lung cancer because of a history of smoking.  A yearly low-dose CT scan of the lungs is recommended for people who:  Currently smoke.  Have quit within the past 15 years.  Have at least a 30-pack-year history of smoking. A pack year is smoking an average of one pack of cigarettes a day for 1 year.  Yearly screening should continue until it has been 15 years since you quit.  Yearly screening should stop if you develop a health problem that would prevent you from having lung cancer treatment.  Breast Cancer  Practice breast self-awareness. This means understanding how your breasts normally appear and feel.  It also means doing regular breast self-exams. Let your health care provider know about any changes, no matter how small.  If you are in your 20s or 30s, you should have a clinical breast exam (CBE) by a health care provider every 1-3 years as part of a regular health exam.  If you are 40 or older, have a CBE every year. Also consider having a breast X-ray (mammogram) every year.  If you have a family history of breast cancer, talk to your health care provider about genetic screening.  If you   are at high risk for breast cancer, talk to your health care provider about having an MRI and a mammogram every year.  Breast cancer gene (BRCA) assessment is recommended for women who have family members with BRCA-related cancers. BRCA-related cancers include:  Breast.  Ovarian.  Tubal.  Peritoneal cancers.  Results of the assessment will determine the need for genetic counseling and BRCA1 and BRCA2 testing. Cervical Cancer Your health care provider may recommend that you be screened regularly for cancer of the pelvic organs (ovaries, uterus, and  vagina). This screening involves a pelvic examination, including checking for microscopic changes to the surface of your cervix (Pap test). You may be encouraged to have this screening done every 3 years, beginning at age 21.  For women ages 30-65, health care providers may recommend pelvic exams and Pap testing every 3 years, or they may recommend the Pap and pelvic exam, combined with testing for human papilloma virus (HPV), every 5 years. Some types of HPV increase your risk of cervical cancer. Testing for HPV may also be done on women of any age with unclear Pap test results.  Other health care providers may not recommend any screening for nonpregnant women who are considered low risk for pelvic cancer and who do not have symptoms. Ask your health care provider if a screening pelvic exam is right for you.  If you have had past treatment for cervical cancer or a condition that could lead to cancer, you need Pap tests and screening for cancer for at least 20 years after your treatment. If Pap tests have been discontinued, your risk factors (such as having a new sexual partner) need to be reassessed to determine if screening should resume. Some women have medical problems that increase the chance of getting cervical cancer. In these cases, your health care provider may recommend more frequent screening and Pap tests. Colorectal Cancer  This type of cancer can be detected and often prevented.  Routine colorectal cancer screening usually begins at 40 years of age and continues through 40 years of age.  Your health care provider may recommend screening at an earlier age if you have risk factors for colon cancer.  Your health care provider may also recommend using home test kits to check for hidden blood in the stool.  A small camera at the end of a tube can be used to examine your colon directly (sigmoidoscopy or colonoscopy). This is done to check for the earliest forms of colorectal  cancer.  Routine screening usually begins at age 50.  Direct examination of the colon should be repeated every 5-10 years through 40 years of age. However, you may need to be screened more often if early forms of precancerous polyps or small growths are found. Skin Cancer  Check your skin from head to toe regularly.  Tell your health care provider about any new moles or changes in moles, especially if there is a change in a mole's shape or color.  Also tell your health care provider if you have a mole that is larger than the size of a pencil eraser.  Always use sunscreen. Apply sunscreen liberally and repeatedly throughout the day.  Protect yourself by wearing long sleeves, pants, a wide-brimmed hat, and sunglasses whenever you are outside. HEART DISEASE, DIABETES, AND HIGH BLOOD PRESSURE   High blood pressure causes heart disease and increases the risk of stroke. High blood pressure is more likely to develop in:  People who have blood pressure in the high end   of the normal range (130-139/85-89 mm Hg).  People who are overweight or obese.  People who are African American.  If you are 38-23 years of age, have your blood pressure checked every 3-5 years. If you are 61 years of age or older, have your blood pressure checked every year. You should have your blood pressure measured twice--once when you are at a hospital or clinic, and once when you are not at a hospital or clinic. Record the average of the two measurements. To check your blood pressure when you are not at a hospital or clinic, you can use:  An automated blood pressure machine at a pharmacy.  A home blood pressure monitor.  If you are between 45 years and 39 years old, ask your health care provider if you should take aspirin to prevent strokes.  Have regular diabetes screenings. This involves taking a blood sample to check your fasting blood sugar level.  If you are at a normal weight and have a low risk for diabetes,  have this test once every three years after 40 years of age.  If you are overweight and have a high risk for diabetes, consider being tested at a younger age or more often. PREVENTING INFECTION  Hepatitis B  If you have a higher risk for hepatitis B, you should be screened for this virus. You are considered at high risk for hepatitis B if:  You were born in a country where hepatitis B is common. Ask your health care provider which countries are considered high risk.  Your parents were born in a high-risk country, and you have not been immunized against hepatitis B (hepatitis B vaccine).  You have HIV or AIDS.  You use needles to inject street drugs.  You live with someone who has hepatitis B.  You have had sex with someone who has hepatitis B.  You get hemodialysis treatment.  You take certain medicines for conditions, including cancer, organ transplantation, and autoimmune conditions. Hepatitis C  Blood testing is recommended for:  Everyone born from [birth year] through 1966.  Anyone with known risk factors for hepatitis C. Sexually transmitted infections (STIs)  You should be screened for sexually transmitted infections (STIs) including gonorrhea and chlamydia if:  You are sexually active and are younger than 40 years of age.  You are older than 40 years of age and your health care provider tells you that you are at risk for this type of infection.  Your sexual activity has changed since you were last screened and you are at an increased risk for chlamydia or gonorrhea. Ask your health care provider if you are at risk.  If you do not have HIV, but are at risk, it may be recommended that you take a prescription medicine daily to prevent HIV infection. This is called pre-exposure prophylaxis (PrEP). You are considered at risk if:  You are sexually active and do not regularly use condoms or know the HIV status of your partner(s).  You take drugs by injection.  You are sexually  active with a partner who has HIV. Talk with your health care provider about whether you are at high risk of being infected with HIV. If you choose to begin PrEP, you should first be tested for HIV. You should then be tested every 3 months for as long as you are taking PrEP.  PREGNANCY   If you are premenopausal and you may become pregnant, ask your health care provider about preconception counseling.  If you may  become pregnant, take 400 to 800 micrograms (mcg) of folic acid every day.  If you want to prevent pregnancy, talk to your health care provider about birth control (contraception). OSTEOPOROSIS AND MENOPAUSE   Osteoporosis is a disease in which the bones lose minerals and strength with aging. This can result in serious bone fractures. Your risk for osteoporosis can be identified using a bone density scan.  If you are 61 years of age or older, or if you are at risk for osteoporosis and fractures, ask your health care provider if you should be screened.  Ask your health care provider whether you should take a calcium or vitamin D supplement to lower your risk for osteoporosis.  Menopause may have certain physical symptoms and risks.  Hormone replacement therapy may reduce some of these symptoms and risks. Talk to your health care provider about whether hormone replacement therapy is right for you.  HOME CARE INSTRUCTIONS   Schedule regular health, dental, and eye exams.  Stay current with your immunizations.   Do not use any tobacco products including cigarettes, chewing tobacco, or electronic cigarettes.  If you are pregnant, do not drink alcohol.  If you are breastfeeding, limit how much and how often you drink alcohol.  Limit alcohol intake to no more than 1 drink per day for nonpregnant women. One drink equals 12 ounces of beer, 5 ounces of wine, or 1 ounces of hard liquor.  Do not use street drugs.  Do not share needles.  Ask your health care provider for help if  you need support or information about quitting drugs.  Tell your health care provider if you often feel depressed.  Tell your health care provider if you have ever been abused or do not feel safe at home.   This information is not intended to replace advice given to you by your health care provider. Make sure you discuss any questions you have with your health care provider.   Document Released: 01/08/2011 Document Revised: 07/16/2014 Document Reviewed: 05/27/2013 Elsevier Interactive Patient Education [employer].

## 2015-05-13 ENCOUNTER — Encounter: Payer: Self-pay | Admitting: Nurse Practitioner

## 2015-05-13 DIAGNOSIS — Z0001 Encounter for general adult medical examination with abnormal findings: Secondary | ICD-10-CM | POA: Insufficient documentation

## 2015-05-13 NOTE — Assessment & Plan Note (Signed)
Discussed acute and chronic issues. Reviewed health maintenance measures, PFSHx, and immunizations.  Patient up-to-date on labs and reviewed with her again at this visit.  Pap due 2018, mammogram declined, flu shot declined  Td booster given today

## 2015-05-17 ENCOUNTER — Other Ambulatory Visit: Payer: Self-pay | Admitting: Nurse Practitioner

## 2015-05-17 DIAGNOSIS — Z1231 Encounter for screening mammogram for malignant neoplasm of breast: Secondary | ICD-10-CM

## 2015-05-27 ENCOUNTER — Ambulatory Visit
Admission: RE | Admit: 2015-05-27 | Discharge: 2015-05-27 | Disposition: A | Discharge status: Home / Self Care | Payer: 59 | Source: Ambulatory Visit | Attending: Nurse Practitioner | Admitting: Nurse Practitioner

## 2015-05-27 DIAGNOSIS — Z1231 Encounter for screening mammogram for malignant neoplasm of breast: Secondary | ICD-10-CM | POA: Diagnosis present

## 2015-08-10 ENCOUNTER — Other Ambulatory Visit: Payer: Self-pay | Admitting: Nurse Practitioner

## 2015-09-01 ENCOUNTER — Telehealth: Payer: Self-pay | Admitting: Nurse Practitioner

## 2015-09-01 NOTE — Telephone Encounter (Signed)
Pt would like to know if she can take sinus Mucinex along  with her blood pressure pill. Please call pt at home number.

## 2015-09-02 NOTE — Telephone Encounter (Signed)
Please advise if this is ok.  

## 2015-09-02 NOTE — Telephone Encounter (Signed)
Attempted to call patient.  Not able to leave a message.

## 2015-09-02 NOTE — Telephone Encounter (Signed)
Spoke with the patient, verbalized understanding 

## 2015-09-02 NOTE — Telephone Encounter (Signed)
Yes. Mucinex (plain kind), not the kind with a decongestant in it.

## 2015-10-24 ENCOUNTER — Telehealth: Payer: Self-pay | Admitting: Nurse Practitioner

## 2015-10-24 NOTE — Telephone Encounter (Signed)
Please advise 

## 2015-10-24 NOTE — Telephone Encounter (Signed)
Pt called about medication amlobipine 10 mg regarding urinating a lot and sometimes light headed. Pt wants to know if the medication can be changed so she does not have to urinate as much? [facility] is [facility] [place] - [id number]. Call pt @ [phone]. Thank you!

## 2015-10-25 NOTE — Telephone Encounter (Signed)
Can you please schedule a medication management appointment with Lyla Sonarrie, 30mins. thanks

## 2015-10-25 NOTE — Telephone Encounter (Signed)
I would like to see her to discuss changing

## 2015-11-02 ENCOUNTER — Ambulatory Visit: Payer: 59 | Admitting: Nurse Practitioner

## 2015-11-03 ENCOUNTER — Encounter: Payer: Self-pay | Admitting: Nurse Practitioner

## 2015-11-03 ENCOUNTER — Ambulatory Visit (INDEPENDENT_AMBULATORY_CARE_PROVIDER_SITE_OTHER): Payer: 59 | Admitting: Nurse Practitioner

## 2015-11-03 VITALS — BP 116/84 | HR 87 | Temp 98.5°F | Resp 16 | Ht 62.0 in | Wt 138.4 lb

## 2015-11-03 DIAGNOSIS — I10 Essential (primary) hypertension: Secondary | ICD-10-CM | POA: Diagnosis not present

## 2015-11-03 DIAGNOSIS — K640 First degree hemorrhoids: Secondary | ICD-10-CM | POA: Diagnosis not present

## 2015-11-03 DIAGNOSIS — K649 Unspecified hemorrhoids: Secondary | ICD-10-CM | POA: Insufficient documentation

## 2015-11-03 MED ORDER — AMLODIPINE BESYLATE 5 MG PO TABS: 5.0000 mg | ORAL_TABLET | Freq: Every day | ORAL | Status: DC

## 2015-11-03 MED ORDER — HYDROCORTISONE 2.5 % RE CREA: 1.0000 "application " | TOPICAL_CREAM | Freq: Two times a day (BID) | RECTAL | Status: DC

## 2015-11-03 NOTE — Patient Instructions (Signed)
Docusate sodium (colace) over the counter + Anusol cream for relief.   Cut down your Norvasc from 10 mg to 5 mg daily.   Keep me up to date via MyChart!

## 2015-11-03 NOTE — Assessment & Plan Note (Signed)
Flaring up.  Sending in Anusol Get Docusate sodium OTC  FU prn worsening/failure to improve.

## 2015-11-03 NOTE — Assessment & Plan Note (Signed)
BP Readings from Last 3 Encounters:  11/03/15 116/84  05/03/15 135/95  03/10/15 140/84   Stable- wants to try 5 mg dosage due to side effects.

## 2015-11-03 NOTE — Progress Notes (Signed)
Patient ID: Margaret May, female    DOB: Jun 26, 1976  Age: 40 y.o. MRN: [id number]  CC: No chief complaint on file.   HPI Margaret May presents for CC of urinary frequency and increased thirst on Norvasc.  1) Pt has been on Norvasc 10 mg daily since August 2016. Noticed increased urination frequency and increased thirst   (in the <1% of adverse effects list)    4 x a week- working out and training  Sodas cut out   Urinating 10-12 x a day   2) Also, reports a flare of hemorrhoids with pruritis and burning. Some relief with defecation if soft. Pt reports having intermittent concerns with this in the past. No current treatment OTC  History Margaret May has a past medical history of GERD (gastroesophageal reflux disease) and Hypertension.   She has past surgical history that includes Upper gi endoscopy (09/2014) and LEEP (2015).   Her family history includes Arthritis in her maternal grandmother; Cervical cancer in her maternal grandmother; Diabetes in her paternal aunt; Hypertension in her maternal grandmother and mother; Kidney disease in her maternal uncle.She reports that she has never smoked. She has never used smokeless tobacco. She reports that she does not drink alcohol or use illicit drugs.  Outpatient Prescriptions Prior to Visit  Medication Sig Dispense Refill  . diazepam (VALIUM) 5 MG tablet Take 1 tablet (5 mg total) by mouth as needed for anxiety. 30 tablet 0  . fluticasone (FLONASE) 50 MCG/ACT nasal spray Place 1 spray into both nostrils every other day.    . ibuprofen (ADVIL,MOTRIN) 800 MG tablet Take 800 mg by mouth every 8 (eight) hours as needed.    . Naproxen Sodium (ALEVE PO) Take 1 tablet by mouth as needed.    . polyethylene glycol powder (GLYCOLAX/MIRALAX) powder Take 1 Container by mouth once.    . Probiotic Product (PROBIOTIC DAILY PO) Take 1 capsule by mouth daily.    . ranitidine (ZANTAC) 150 MG tablet Take 1 tablet (150 mg total)  by mouth as needed for heartburn. 90 tablet 1  . NORVASC 10 MG tablet Take 1 tablet by mouth  daily 90 tablet 1   No facility-administered medications prior to visit.    ROS Review of Systems  Constitutional: Negative for fever, chills, diaphoresis and fatigue.  Endocrine: Positive for polydipsia. Negative for polyphagia and polyuria.  Genitourinary: Positive for frequency. Negative for dysuria, urgency, hematuria, flank pain, decreased urine volume, difficulty urinating and pelvic pain.  Skin: Negative for rash.    Objective:  BP 116/84 mmHg  Pulse 87  Temp(Src) 98.5 F (36.9 C) (Oral)  Resp 16  Ht  (1.575 m)  Wt 138 lb 6.4 oz (62.778 kg)  BMI 25.31 kg/m2  SpO2 99%  LMP 10/23/2015  Physical Exam  Constitutional: She is oriented to person, place, and time. She appears well-developed and well-nourished. No distress.  HENT:  Head: Normocephalic and atraumatic.  Right Ear: External ear normal.  Left Ear: External ear normal.  Abdominal: There is no CVA tenderness.  Neurological: She is alert and oriented to person, place, and time.  Skin: Skin is warm and dry. No rash noted. She is not diaphoretic.  Psychiatric: She has a normal mood and affect. Her behavior is normal. Judgment and thought content normal.   Assessment & Plan:   Diagnoses and all orders for this visit:  Benign essential HTN -     Basic Metabolic Panel (BMET)  First degree hemorrhoids  Other  orders -     hydrocortisone (ANUSOL-HC) 2.5 % rectal cream; Place 1 application rectally 2 (two) times daily. -     amLODipine (NORVASC) 5 MG tablet; Take 1 tablet (5 mg total) by mouth daily.  I have discontinued [name]'s NORVASC. I am also having her start on hydrocortisone and amLODipine. Additionally, I am having her maintain her fluticasone, Probiotic Product (PROBIOTIC DAILY PO), polyethylene glycol powder, Naproxen Sodium (ALEVE PO), ibuprofen, ranitidine, and diazepam.  Meds ordered this encounter   Medications  . hydrocortisone (ANUSOL-HC) 2.5 % rectal cream    Sig: Place 1 application rectally 2 (two) times daily.    Dispense:  30 g    Refill:  0    Order Specific Question:  Supervising Provider    Answer:  Duncan DullULLO, TERESA L [[id number]]  . amLODipine (NORVASC) 5 MG tablet    Sig: Take 1 tablet (5 mg total) by mouth daily.    Dispense:  90 tablet    Refill:  3    Order Specific Question:  Supervising Provider    Answer:  Sherlene ShamsULLO, TERESA L [[id number]]     Follow-up: Return if symptoms worsen or fail to improve.

## 2015-11-04 LAB — BASIC METABOLIC PANEL
BUN: 9 mg/dL (ref 6–23)
CHLORIDE: 101 meq/L (ref 96–112)
CO2: 27 meq/L (ref 19–32)
Calcium: 9.6 mg/dL (ref 8.4–10.5)
Creatinine, Ser: 0.77 mg/dL (ref 0.40–1.20)
GFR: 106.25 mL/min (ref >60.00)
GLUCOSE: 83 mg/dL (ref 70–99)
POTASSIUM: 3.3 meq/L — AB (ref 3.5–5.1)
SODIUM: 136 meq/L (ref 135–145)

## 2016-03-05 ENCOUNTER — Other Ambulatory Visit: Payer: Self-pay | Admitting: Nurse Practitioner

## 2016-03-06 NOTE — Telephone Encounter (Signed)
Future appointment with Dr.Sonnenberh on 05/04/16 and medication last refilled 11/03/15 for 90-days with 3 refilled.

## 2016-03-06 NOTE — Telephone Encounter (Signed)
Sent to pharmacy 

## 2016-04-17 ENCOUNTER — Telehealth: Payer: Self-pay | Admitting: *Deleted

## 2016-04-17 NOTE — Telephone Encounter (Signed)
Patient requested to have a lbs drawn at [facility] , if labs are needed prior to her 05-04-16 physical appt.  Pt contact [phone]

## 2016-04-18 NOTE — Telephone Encounter (Signed)
Notified patient that Dr. Birdie SonsSonnenberg will order the labs the day of the appointment.

## 2016-05-04 ENCOUNTER — Encounter: Payer: Self-pay | Admitting: Family Medicine

## 2016-05-04 ENCOUNTER — Ambulatory Visit (INDEPENDENT_AMBULATORY_CARE_PROVIDER_SITE_OTHER): Payer: 59 | Admitting: Family Medicine

## 2016-05-04 VITALS — BP 118/84 | HR 86 | Temp 98.4°F | Ht 61.5 in | Wt 138.8 lb

## 2016-05-04 DIAGNOSIS — Z1239 Encounter for other screening for malignant neoplasm of breast: Secondary | ICD-10-CM

## 2016-05-04 DIAGNOSIS — Z1231 Encounter for screening mammogram for malignant neoplasm of breast: Secondary | ICD-10-CM

## 2016-05-04 DIAGNOSIS — Z0000 Encounter for general adult medical examination without abnormal findings: Secondary | ICD-10-CM

## 2016-05-04 DIAGNOSIS — Z0001 Encounter for general adult medical examination with abnormal findings: Secondary | ICD-10-CM

## 2016-05-04 NOTE — Assessment & Plan Note (Signed)
Overall doing well. Encouraged continued exercise. Blood pressure at goal. Lab work will be ordered with CMP, lipid panel, and A1c. I encouraged her to contact her gynecologist for follow-up for Pap smear. I offered to have us contact them though she deferred this. Mammogram will be ordered. She declined flu shot. Offered irrigation for her left ear though she declined and said she will contact ENT. Encouraged fiber intake for constipation.

## 2016-05-04 NOTE — Progress Notes (Signed)
Margaret Alar, MD Phone: [phone]  Margaret May is a 40 y.o. female who presents today for physical exam.  Patient exercises by going to the gym and working with a trainer 4 days a week. Diet is described as eating whatever she wants. Reports last Pap smear was 2 years ago and was abnormal. Sounds as though she had a colposcopy through gynecology. She is going to schedule an appointment with them for follow-up. Is due for mammogram. Prior HIV testing negative per her report. Up-to-date on tetanus vaccination. She declines a flu shot. No tobacco use or illicit drug use. Rare alcohol use. Sees ophthalmologist yearly. Dentist twice a year.  Family history of ovarian cancer in her grandmother.  Patient reports over the last 1-1/2 weeks she's had some increased ear popping and fullness. Does take Flonase. Does have a history of having to get her ears cleaned out by ear nose and throat.  Notes some constipation at times. Did go about 3 days without a bowel movement though didn't did have any other abdominal symptoms. Notes this occurred after eating a significant amount of cheese.  Active Ambulatory Problems    Diagnosis Date Noted  . Encounter to establish care 09/26/2014  . Benign paroxysmal positional vertigo 09/26/2014  . GERD (gastroesophageal reflux disease) 09/26/2014  . Anxiety state 09/26/2014  . Urinary frequency 12/16/2014  . Hypokalemia 01/26/2015  . Benign essential HTN 01/26/2015  . Palpitations 03/15/2015  . Encounter for general adult medical examination with abnormal findings 05/13/2015  . Hemorrhoid 11/03/2015   Resolved Ambulatory Problems    Diagnosis Date Noted  . No Resolved Ambulatory Problems   Past Medical History:  Diagnosis Date  . GERD (gastroesophageal reflux disease)   . Hypertension     Family History  Problem Relation Age of Onset  . Hypertension Mother   . Kidney disease Maternal Uncle   . Diabetes Paternal Aunt   . Arthritis  Maternal Grandmother   . Hypertension Maternal Grandmother   . Cervical cancer Maternal Grandmother     Social History   Social History  . Marital status: Single    Spouse name: N/A  . Number of children: N/A  . Years of education: N/A   Occupational History  . Not on file.   Social History Main Topics  . Smoking status: Never Smoker  . Smokeless tobacco: Never Used  . Alcohol use No  . Drug use: No  . Sexual activity: Not Currently   Other Topics Concern  . 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 CV:  Negative for chest pain, dyspnea, edema, palpitations Resp: Negative for cough, dyspnea, hemoptysis GI: Positive for constipation, Negative for nausea, vomiting, diarrhea, abdominal pain, melena, hematochezia. GU: Negative for dysuria, incontinence, urinary hesitance, hematuria, vaginal or penile discharge, polyuria, sexual difficulty, lumps in testicle or breasts MSK: Negative for muscle cramps or aches, joint pain or swelling Neuro: Negative for headaches, weakness, numbness, dizziness, passing out/fainting Psych: Negative for depression, anxiety, memory problems  Objective  Physical Exam Vitals:  05/04/16 1521  BP: 118/84  Pulse: 86  Temp: 98.4 F (36.9 C)    BP Readings from Last 3 Encounters:  05/04/16 118/84  11/03/15 116/84  05/03/15 (!) 135/95   Wt Readings from Last 3 Encounters:  05/04/16 138 lb 12.8 oz (63 kg)  11/03/15 138 lb 6.4 oz (62.8 kg)  05/03/15 136 lb (61.7 kg)    Physical Exam  Constitutional: She is  well-developed, well-nourished, and in no distress.  HENT:  Head: Normocephalic and atraumatic.  Mouth/Throat: Oropharynx is clear and moist. No oropharyngeal exudate.  Left TM obscured by cerumen, right TM normal, offered irrigation though she deferred  Eyes: Conjunctivae are normal. Pupils are equal, round, and reactive to light.  Neck: Neck supple.  Cardiovascular: Normal rate and normal heart sounds.   Pulmonary/Chest: Effort normal and breath sounds normal.  Abdominal: Soft. Bowel sounds are normal. She exhibits no distension. There is no tenderness. There is no rebound and no guarding.  Genitourinary:  Genitourinary Comments: Bilateral breast and axilla without masses, no nipple inversion, no skin changes, patient deferred pelvic exam  Musculoskeletal: She exhibits no edema.  Lymphadenopathy:    She has no cervical adenopathy.  Neurological: She is alert. Gait normal.  Skin: Skin is warm and dry.  Psychiatric: Mood and affect normal.     Assessment/Plan:   Encounter for general adult medical examination with abnormal findings Overall doing well. Encouraged continued exercise. Blood pressure at goal. Lab work will be ordered with CMP, lipid panel, and A1c. I encouraged her to contact her gynecologist for follow-up for Pap smear. I offered to have us contact them though she deferred this. Mammogram will be ordered. She declined flu shot. Offered irrigation for her left ear though she declined and said she will contact ENT. Encouraged fiber intake for constipation.   Orders Placed This Encounter  Procedures  . MM SCREENING BREAST TOMO BILATERAL    Standing Status:   Future    Standing Expiration Date:   07/04/2017    Order Specific Question:   Reason for Exam (SYMPTOM  OR DIAGNOSIS REQUIRED)    Answer:   breast cancer screening    Order Specific Question:   Is the patient pregnant?    Answer:   No    Order Specific Question:   Preferred imaging location?    Answer:   [facility]    No orders of the defined types were placed in this encounter.    Margaret AlarEric Lorri Fukuhara, MD [facility] [place]

## 2016-05-04 NOTE — Patient Instructions (Signed)
Nice to see you. Please continue to work on exercise. Please contact your gynecologist for follow-up. We will get a mammogram scheduled for you. Please follow up with ENT for your earwax. Please incorporate more fiber in your diet for your constipation. Please get the lab work fasting.

## 2016-05-16 ENCOUNTER — Telehealth: Payer: Self-pay | Admitting: *Deleted

## 2016-05-16 NOTE — Telephone Encounter (Signed)
Do you have these forms?

## 2016-05-16 NOTE — Telephone Encounter (Signed)
Pt requested a update of the paperwork she left with the provider at her physical. She stated it was a disability for for pt to have heater of electric blanket at work Pt contact [phone]

## 2016-05-17 NOTE — Telephone Encounter (Signed)
Forms completed. Please let patient know.

## 2016-05-17 NOTE — Telephone Encounter (Signed)
Placed forms up front for the patient to pick up and notified patient

## 2016-05-21 ENCOUNTER — Other Ambulatory Visit: Payer: Self-pay | Admitting: Family Medicine

## 2016-05-22 LAB — COMPREHENSIVE METABOLIC PANEL
ALBUMIN: 4.4 g/dL (ref 3.5–5.5)
ALK PHOS: 68 IU/L (ref 39–117)
ALT: 6 IU/L (ref 0–32)
AST: 14 IU/L (ref 0–40)
Albumin/Globulin Ratio: 1.4 (ref 1.2–2.2)
BUN / CREAT RATIO: 13 (ref 9–23)
BUN: 12 mg/dL (ref 6–24)
Bilirubin Total: 0.4 mg/dL (ref 0.0–1.2)
CO2: 24 mmol/L (ref 18–29)
CREATININE: 0.95 mg/dL (ref 0.57–1.00)
Calcium: 9.6 mg/dL (ref 8.7–10.2)
Chloride: 101 mmol/L (ref 96–106)
GFR calc non Af Amer: 75 mL/min/{1.73_m2} (ref >59)
GFR, EST AFRICAN AMERICAN: 86 mL/min/{1.73_m2} (ref >59)
GLOBULIN, TOTAL: 3.1 g/dL (ref 1.5–4.5)
Glucose: 89 mg/dL (ref 65–99)
Potassium: 3.9 mmol/L (ref 3.5–5.2)
SODIUM: 142 mmol/L (ref 134–144)
TOTAL PROTEIN: 7.5 g/dL (ref 6.0–8.5)

## 2016-05-22 LAB — LIPID PANEL W/O CHOL/HDL RATIO
CHOLESTEROL TOTAL: 128 mg/dL (ref 100–199)
HDL: 45 mg/dL (ref >39)
LDL CALC: 77 mg/dL (ref 0–99)
Triglycerides: 32 mg/dL (ref 0–149)
VLDL CHOLESTEROL CAL: 6 mg/dL (ref 5–40)

## 2016-05-22 LAB — HGB A1C W/O EAG: HEMOGLOBIN A1C: 5.4 % (ref 4.8–5.6)

## 2016-06-04 ENCOUNTER — Telehealth: Payer: Self-pay | Admitting: Family Medicine

## 2016-06-04 NOTE — Telephone Encounter (Signed)
Pt dropped forms back off stating that  # 4 amd #10 needed more detail.Marland Kitchen. Please advise  Placed in Dr. Kermit BaloSonnenbergs folder up front

## 2016-06-06 ENCOUNTER — Ambulatory Visit
Admission: RE | Admit: 2016-06-06 | Discharge: 2016-06-06 | Disposition: A | Discharge status: Home / Self Care | Payer: 59 | Source: Ambulatory Visit | Attending: Family Medicine | Admitting: Family Medicine

## 2016-06-06 DIAGNOSIS — Z1231 Encounter for screening mammogram for malignant neoplasm of breast: Secondary | ICD-10-CM | POA: Insufficient documentation

## 2016-06-06 DIAGNOSIS — Z1239 Encounter for other screening for malignant neoplasm of breast: Secondary | ICD-10-CM

## 2016-06-07 NOTE — Telephone Encounter (Signed)
Spoke with patient and she stated that she has an appointment on Monday so she said she will pick them up then.

## 2016-06-11 ENCOUNTER — Encounter: Payer: Self-pay | Admitting: Family

## 2016-06-11 ENCOUNTER — Ambulatory Visit (INDEPENDENT_AMBULATORY_CARE_PROVIDER_SITE_OTHER): Payer: 59 | Admitting: Family

## 2016-06-11 VITALS — BP 122/84 | HR 87 | Ht 62.0 in | Wt 140.8 lb

## 2016-06-11 DIAGNOSIS — M25561 Pain in right knee: Secondary | ICD-10-CM

## 2016-06-11 NOTE — Progress Notes (Signed)
Subjective:    Patient ID: Margaret May, female    DOB: 5/Margaret May/1977, 40 y.o.   MRN: [id number]  CC: Margaret May is a 40 y.o. female who presents today for an acute visit.    HPI: CC: right knee lateral pain for past month, unchanged. No injury. Does leg lifts, squats without pain. Walking long distances aggravates pain. Describes dull ache. Has taken aleve with some relief.     HISTORY:  Past Medical History:  Diagnosis Date  . GERD (gastroesophageal reflux disease)   . Hypertension    Past Surgical History:  Procedure Laterality Date  . LEEP  2015  . UPPER GI ENDOSCOPY  09/2014   Family History  Problem Relation Age of Onset  . Hypertension Mother   . Kidney disease Maternal Uncle   . Diabetes Paternal Aunt   . Arthritis Maternal Grandmother   . Hypertension Maternal Grandmother   . Cervical cancer Maternal Grandmother     Allergies: Patient has no known allergies. Current Outpatient Prescriptions on File Prior to Visit  Medication Sig Dispense Refill  . amLODipine (NORVASC) 10 MG tablet Take 1 tablet by mouth  daily 90 tablet 3  . fluticasone (FLONASE) 50 MCG/ACT nasal spray Place 1 spray into both nostrils every other day.    . hydrocortisone (ANUSOL-HC) 2.5 % rectal cream Place 1 application rectally 2 (two) times daily. 30 g 0  . ibuprofen (ADVIL,MOTRIN) 800 MG tablet Take 800 mg by mouth every 8 (eight) hours as needed.    . Naproxen Sodium (ALEVE PO) Take 1 tablet by mouth as needed.    . polyethylene glycol powder (GLYCOLAX/MIRALAX) powder Take 1 Container by mouth once.    . Probiotic Product (PROBIOTIC DAILY PO) Take 1 capsule by mouth daily.    . ranitidine (ZANTAC) 150 MG tablet Take 1 tablet (150 mg total) by mouth as needed for heartburn. 90 tablet 1   No current facility-administered medications on file prior to visit.     Social History  Substance Use Topics  . Smoking status: Never Smoker  . Smokeless tobacco: Never Used  . Alcohol use  No    Review of Systems  Constitutional: Negative for chills and fever.  Respiratory: Negative for cough.   Cardiovascular: Negative for chest pain and palpitations.  Gastrointestinal: Negative for nausea and vomiting.      Objective:    BP 122/84   Pulse 87   Ht 5\' 2"  (1.575 m)   Wt 140 lb 12.8 oz (63.9 kg)   LMP 05/20/2016   SpO2 97%   BMI 25.75 kg/m    Physical Exam  Constitutional: She appears well-developed and well-nourished.  Eyes: Conjunctivae are normal.  Cardiovascular: Normal rate, regular rhythm, normal heart sounds and normal pulses.   Pulmonary/Chest: Effort normal and breath sounds normal. She has no wheezes. She has no rhonchi. She has no rales.  Musculoskeletal:       Right knee: She exhibits normal range of motion, no swelling, no effusion, no laceration and no erythema. No tenderness found.  Bilateral knees are symmetric. No effusion appreciated. No increase in warmth or erythema. No crepitus felt with flexion of bilateral knees.  Right knee:  Full ROM with extension and flexion. No catching with McMurray maneuver. No patellar apprehension. Negative anterior drawer and lachman's- no laxity appreciated.   Neurological: She is alert.  Skin: Skin is warm and dry.  Psychiatric: She has a normal mood and affect. Her speech is normal and behavior  is normal. Thought content normal.  Vitals reviewed.      Assessment & Plan:   1. Acute pain of right knee Suspect overuse syndrome, patellofemoral syndrome. Patient and I agreed on conservative management and home exercises at this time. Will consider referral to orthopedics if pain does not improve.   I am having [name] maintain her fluticasone, Probiotic Product (PROBIOTIC DAILY PO), polyethylene glycol powder, Naproxen Sodium (ALEVE PO), ibuprofen, ranitidine, hydrocortisone, and amLODipine.   No orders of the defined types were placed in this encounter.   Return precautions given.   Risks,  benefits, and alternatives of the medications and treatment plan prescribed today were discussed, and patient expressed understanding.   Education regarding symptom management and diagnosis given to patient on AVS.  Continue to follow with Marikay AlarEric Sonnenberg, MD for routine health maintenance.   Margaret May and I agreed with plan.   Rennie PlowmanMargaret Moriya Mitchell, FNP

## 2016-06-11 NOTE — Progress Notes (Signed)
Pre visit review using our clinic review tool, if applicable. No additional management support is needed unless otherwise documented below in the visit note. 

## 2016-06-11 NOTE — Patient Instructions (Signed)
Suspect overuse syndrome, patellofemoral syndrome.   Focus on exercise and strengthening of quadriceps such as leg lifts lying down.. I would take it easy on squats until your pain improves.  Rest, ice/heat, neoprene sleeve and occasional ibuprofen.   If this doesn't help, please let us know   Patellofemoral Pain Syndrome Introduction Patellofemoral pain syndrome is a condition that involves a softening or breakdown of the tissue (cartilage) on the underside of your kneecap (patella). This causes pain in the front of the knee. The condition is also called runner's knee or chondromalacia patella. Patellofemoral pain syndrome is most common in young adults who are active in sports. Your knee is the largest joint in your body. The patella covers the front of your knee and is attached to muscles above and below your knee. The underside of the patella is covered with a smooth type of cartilage (synovium). The smooth surface helps the patella glide easily when you move your knee. Patellofemoral pain syndrome causes swelling in the joint linings and bone surfaces in your knee. What are the causes? Patellofemoral pain syndrome can be caused by:  Overuse.  Poor alignment of your knee joints.  Weak leg muscles.  A direct blow to your kneecap. What increases the risk? You may be at risk for patellofemoral pain syndrome if you:  Do a lot of activities that can wear down your kneecap. These include:  Running.  Squatting.  Climbing stairs.  Start a new physical activity or exercise program.  Wear shoes that do not fit well.  Do not have good leg strength.  Are overweight. What are the signs or symptoms? Knee pain is the most common symptom of patellofemoral pain syndrome. This may feel like a dull, aching pain underneath your patella, in the front of your knee. There may be a popping or cracking sound when you move your knee. Pain may get worse with:  Exercise.  Climbing  stairs.  Running.  Jumping.  Squatting.  Kneeling.  Sitting for a long time.  Moving or pushing on your patella. How is this diagnosed? Your health care provider may be able to diagnose patellofemoral pain syndrome from your symptoms and medical history. You may be asked about your recent physical activities and which ones cause knee pain. Your health care provider may do a physical exam with certain tests to confirm the diagnosis. These may include:  Moving your patella back and forth.  Checking your range of knee motion.  Having you squat or jump to see if you have pain.  Checking the strength of your leg muscles. An MRI of the knee may also be done. How is this treated? Patellofemoral pain syndrome can usually be treated at home with rest, ice, compression, and elevation (RICE). Other treatments may include:  Nonsteroidal anti-inflammatory drugs (NSAIDs).  Physical therapy to stretch and strengthen your leg muscles.  Shoe inserts (orthotics) to take stress off your knee.  A knee brace or knee support.  Surgery to remove damaged cartilage or move the patella to a better position. The need for surgery is rare. Follow these instructions at home:  Take medicines only as directed by your health care provider.  Rest your knee.  When resting, keep your knee raised above the level of your heart.  Avoid activities that cause knee pain.  Apply ice to the injured area:  Put ice in a plastic bag.  Place a towel between your skin and the bag.  Leave the ice on for 20 minutes,  2-3 times a day.  Use splints, braces, knee supports, or walking aids as directed by your health care provider.  Perform stretching and strengthening exercises as directed by your health care provider or physical therapist.  Keep all follow-up visits as directed by your health care provider. This is important. Contact a health care provider if:  Your symptoms get worse.  You are not improving  with home care. This information is not intended to replace advice given to you by your health care provider. Make sure you discuss any questions you have with your health care provider. Document Released: 06/13/2009 Document Revised: 12/01/2015 Document Reviewed: 09/14/2013  2017 Elsevier

## 2016-11-02 ENCOUNTER — Ambulatory Visit: Payer: 59 | Admitting: Family Medicine

## 2016-11-07 ENCOUNTER — Other Ambulatory Visit: Payer: Self-pay | Admitting: Nurse Practitioner

## 2016-11-15 ENCOUNTER — Other Ambulatory Visit: Payer: Self-pay

## 2016-11-15 NOTE — Telephone Encounter (Deleted)
Patient has 5 mg and 10 mg scripts on hand . She states when she takes amlodipine 10 mg she has to urinate more frequently . She alternates between taking both scripts.  Please advise on which to refill . Thanks.

## 2016-11-15 NOTE — Telephone Encounter (Signed)
Duplicate message. 

## 2016-12-21 ENCOUNTER — Ambulatory Visit: Payer: 59 | Admitting: Family Medicine

## 2017-01-14 ENCOUNTER — Ambulatory Visit (INDEPENDENT_AMBULATORY_CARE_PROVIDER_SITE_OTHER): Payer: 59 | Admitting: Family Medicine

## 2017-01-14 ENCOUNTER — Encounter: Payer: Self-pay | Admitting: Family Medicine

## 2017-01-14 VITALS — BP 136/92 | HR 77 | Temp 98.5°F | Wt 137.8 lb

## 2017-01-14 DIAGNOSIS — G2581 Restless legs syndrome: Secondary | ICD-10-CM

## 2017-01-14 DIAGNOSIS — I10 Essential (primary) hypertension: Secondary | ICD-10-CM

## 2017-01-14 DIAGNOSIS — M25561 Pain in right knee: Secondary | ICD-10-CM | POA: Insufficient documentation

## 2017-01-14 NOTE — Assessment & Plan Note (Signed)
Aching in her legs at night could be related to restless leg syndrome or the amount she walked the day of. She does report some restless leg symptoms as well. We'll check iron levels and ferritin. Also check CMP.

## 2017-01-14 NOTE — Progress Notes (Signed)
  Tommi Rumps, MD Phone: [phone]  Margaret May is a 41 y.o. female who presents today for follow-up.  Hypertension: Typically less than 120/90. Amlodipine 5 mg is being taken. No chest pain, shortness breath, or edema.  Patient notes her right knee was bothering her while doing squats at the gym. Now only bothers her if she walks for a really long duration. No pain currently. No specific injury to it.  Patient notes bilateral legs ache at night. She feels the need to move them. She notes the aching has only happened after doing lots of walking. It has occurred 1-2 times this past month. Does feel restless office more frequently.  PMH: nonsmoker.   ROS see history of present illness  Objective  Physical Exam Vitals:   01/14/17 1455  BP: (!) 136/92  Pulse: 77  Temp: 98.5 F (36.9 C)    BP Readings from Last 3 Encounters:  01/14/17 (!) 136/92  06/11/16 122/84  05/04/16 118/84   Wt Readings from Last 3 Encounters:  01/14/17 137 lb 12.8 oz (62.5 kg)  06/11/16 140 lb 12.8 oz (63.9 kg)  05/04/16 138 lb 12.8 oz (63 kg)    Physical Exam  Constitutional: No distress.  Cardiovascular: Normal rate, regular rhythm and normal heart sounds.   Pulmonary/Chest: Effort normal and breath sounds normal.  Musculoskeletal: She exhibits no edema.  Bilateral calves and quads and anterior lower legs nontender, bilateral knees no tenderness, warmth, erythema, or swelling, no ligamentous laxity, negative McMurray's  Neurological: She is alert. Gait normal.  Skin: Skin is warm and dry. She is not diaphoretic.     Assessment/Plan: Please see individual problem list.  Benign essential HTN Above goal. Discussed increasing amlodipine though she is hesitant to do this. She wants to work on diet and exercise. Encouraged dietary changes and exercise. We'll recheck in a month with the nurse.  Right knee pain Benign exam. No specific injury. Potentially could be arthritis changes  versus a meniscal issue. Discussed monitoring and if becomes persistent or worsens letting us know  Restless leg syndrome Aching in her legs at night could be related to restless leg syndrome or the amount she walked the day of. She does report some restless leg symptoms as well. We'll check iron levels and ferritin. Also check CMP.   Orders Placed This Encounter  Procedures  . Comp Met (CMET)  . Iron and TIBC  . Ferritin   Tommi Rumps, MD [place]

## 2017-01-14 NOTE — Patient Instructions (Signed)
Nice to see you. We'll get some lab work today. Please try to watch the aching and if this worsens or becomes more persistent please let us know. Please work on diet and exercises discussed.

## 2017-01-14 NOTE — Assessment & Plan Note (Signed)
Above goal. Discussed increasing amlodipine though she is hesitant to do this. She wants to work on diet and exercise. Encouraged dietary changes and exercise. We'll recheck in a month with the nurse.

## 2017-01-14 NOTE — Assessment & Plan Note (Signed)
Benign exam. No specific injury. Potentially could be arthritis changes versus a meniscal issue. Discussed monitoring and if becomes persistent or worsens letting us know

## 2017-01-15 LAB — COMPREHENSIVE METABOLIC PANEL
A/G RATIO: 1.3 (ref 1.2–2.2)
ALT: 7 IU/L (ref 0–32)
AST: 15 IU/L (ref 0–40)
Albumin: 4.8 g/dL (ref 3.5–5.5)
Alkaline Phosphatase: 73 IU/L (ref 39–117)
BILIRUBIN TOTAL: 0.4 mg/dL (ref 0.0–1.2)
BUN/Creatinine Ratio: 9 (ref 9–23)
BUN: 8 mg/dL (ref 6–24)
CHLORIDE: 102 mmol/L (ref 96–106)
CO2: 21 mmol/L (ref 20–29)
Calcium: 9.7 mg/dL (ref 8.7–10.2)
Creatinine, Ser: 0.87 mg/dL (ref 0.57–1.00)
GFR calc non Af Amer: 82 mL/min/{1.73_m2} (ref >59)
GFR, EST AFRICAN AMERICAN: 95 mL/min/{1.73_m2} (ref >59)
GLUCOSE: 87 mg/dL (ref 65–99)
Globulin, Total: 3.7 g/dL (ref 1.5–4.5)
POTASSIUM: 3.4 mmol/L — AB (ref 3.5–5.2)
Sodium: 143 mmol/L (ref 134–144)
Total Protein: 8.5 g/dL (ref 6.0–8.5)

## 2017-01-15 LAB — IRON AND TIBC
IRON SATURATION: 13 % — AB (ref 15–55)
Iron: 43 ug/dL (ref 27–159)
TIBC: 334 ug/dL (ref 250–450)
UIBC: 291 ug/dL (ref 131–425)

## 2017-01-15 LAB — FERRITIN: Ferritin: 43 ng/mL (ref 15–150)

## 2017-01-16 ENCOUNTER — Other Ambulatory Visit: Payer: Self-pay | Admitting: Family Medicine

## 2017-01-16 DIAGNOSIS — G2581 Restless legs syndrome: Secondary | ICD-10-CM

## 2017-01-16 MED ORDER — FERROUS SULFATE 325 (65 FE) MG PO TABS: 325.0000 mg | ORAL_TABLET | Freq: Every day | ORAL | 3 refills | Status: DC

## 2017-02-11 ENCOUNTER — Telehealth: Payer: Self-pay | Admitting: *Deleted

## 2017-02-11 NOTE — Telephone Encounter (Signed)
Please make sure the patient is not having any trouble breathing or fevers. She could try Flonase and Claritin over-the-counter. If symptoms are not improving with this or with time she should let us know.

## 2017-02-11 NOTE — Telephone Encounter (Signed)
Patient requested a call, pt currently has congestion, sneezing and cough she requested to know what she could take OTC that would not collide with her having high blood pressure Pt contact [phone]

## 2017-02-11 NOTE — Telephone Encounter (Signed)
Patient notified and states she does not have a fever, she does not have trouble breathing but due to the nasal congestion she has to breather through her mouth. Patient states she will try Flonase and Claritin.

## 2017-02-11 NOTE — Telephone Encounter (Signed)
Please advise 

## 2017-02-14 ENCOUNTER — Ambulatory Visit (INDEPENDENT_AMBULATORY_CARE_PROVIDER_SITE_OTHER): Payer: 59

## 2017-02-14 VITALS — BP 142/92 | HR 83

## 2017-02-14 DIAGNOSIS — G2581 Restless legs syndrome: Secondary | ICD-10-CM

## 2017-02-14 DIAGNOSIS — I10 Essential (primary) hypertension: Secondary | ICD-10-CM

## 2017-02-14 NOTE — Progress Notes (Signed)
Patient comes in today for BP check. BP in right arm 142/92 and Left arm 140/96 Pulse 83 O2 98%. Spoke with Dr. Adriana Simasook and he stated she continues to take her Amlodipine 10 mg as prescribed. Watch salt intakes and drink plenty fluids. Patient was advise to monitor BP and keep logs of her BP.

## 2017-02-15 ENCOUNTER — Telehealth: Payer: Self-pay | Admitting: Family Medicine

## 2017-02-15 LAB — FERRITIN: Ferritin: 61 ng/mL (ref 15–150)

## 2017-02-15 LAB — IRON AND TIBC
Iron Saturation: 23 % (ref 15–55)
Iron: 69 ug/dL (ref 27–159)
TIBC: 297 ug/dL (ref 250–450)
UIBC: 228 ug/dL (ref 131–425)

## 2017-02-15 NOTE — Telephone Encounter (Signed)
Pt was transferred to [facility].

## 2017-02-15 NOTE — Telephone Encounter (Signed)
CMA called to triage, scheduled with PCP on Monday. thanks

## 2017-02-15 NOTE — Telephone Encounter (Signed)
Pt reported no acute distress. Stated legs just feel weak and feels like there is a knot on the back of right leg. Pt scheduled for Monday at 1:15 pm to see PCP.

## 2017-02-15 NOTE — Progress Notes (Signed)
Patient states she is taking amlodipine 10mg , she states she has been taking this for the past week

## 2017-02-15 NOTE — Progress Notes (Signed)
She should monitor her blood pressure given that she just increased this for the next 1-2 weeks and contact us with the readings.

## 2017-02-15 NOTE — Progress Notes (Signed)
Please confirm whether or not the patient is taking amlodipine 10 mg or amlodipine 5 mg. I believe based on our prior discussion she was taking 5 mg. Please let me know what dose she is on.  Marikay AlarEric Dalis Beers, M.D.

## 2017-02-15 NOTE — Telephone Encounter (Signed)
Please triage

## 2017-02-15 NOTE — Telephone Encounter (Signed)
Patient stated she would go to walk in or ED to be evaluated today. Pt canceled appt for Monday with pcp.

## 2017-02-15 NOTE — Telephone Encounter (Signed)
I would suggest evaluation of the walk-in clinic or urgent care today given the knot on the back of her right leg. Please inform her of this. If she does not want to be evaluated today she should seek medical attention over the weekend if this worsens. Thanks.

## 2017-02-15 NOTE — Telephone Encounter (Signed)
Pt called back to inform you that both of her legs are feeling heavy and knot on the right leg. Pt states she was told to call back. Please advise?  Call pt @ [phone]. Thank you!

## 2017-02-15 NOTE — Telephone Encounter (Signed)
FYI

## 2017-02-15 NOTE — Progress Notes (Signed)
Patient notified

## 2017-02-15 NOTE — Telephone Encounter (Signed)
[facility] - [place] TELEPHONE ADVICE RECORD   [facility]     [facility] Initial Comment Caller states both of her legs feeling heavy and a knot on Margaret right one. Told to contact office back, but they want her to speak with a nurse. No swelling.   DOB: 11/09/1975      Nurse Assessment  Nurse: Karlene LinemanKluth, RN, Bonita QuinLinda Date/Time (Eastern Time): 02/15/2017 2:50:54 PM  Confirm and document reason for call. If symptomatic, describe symptoms. ---Caller states both of her legs feeling heavy and a knot on Margaret right one. Lump has been there before and returned and started hurting; lump is above ankle in lower leg; looks like a bruise; no difference between both feet. Pain is 3/10, nagging pain. Office called iron levels. Had a follow up appointment yesterday about b/p and iron levels. No swelling.  Does Margaret patient have any new or worsening symptoms? ---Yes  Will a triage be completed? ---Yes  Related visit to physician within Margaret last 2 weeks? ---Yes  Does Margaret PT have any chronic conditions? (i.e. diabetes, asthma, etc.) ---Yes  List chronic conditions. ---anemia htn  Is Margaret patient pregnant or possibly pregnant? (Ask all females between Margaret ages of 1112-55) ---No  Is this a behavioral health or substance abuse call? ---No    Guidelines     Guideline Title Affirmed Question Affirmed Notes   Leg Pain Localized pain, redness or hard lump along vein    Final Disposition User   See Physician within 24 Hours Kluth, RN, Bonita QuinLinda   Comments   Caller requested to speak with nurse in [facility] office since she was told to call back to [facility] office if she had any problems. (when she was seen there yesterday.) Connected caller with Nurse Nicki Guadalajararicia in [facility] office.   Referrals   [facility]   [facility]   [facility]   Disagree/Comply: Comply

## 2017-02-18 ENCOUNTER — Ambulatory Visit: Payer: 59 | Admitting: Family Medicine

## 2017-04-10 ENCOUNTER — Other Ambulatory Visit: Payer: Self-pay | Admitting: *Deleted

## 2017-04-10 MED ORDER — AMLODIPINE BESYLATE 10 MG PO TABS: 10.0000 mg | ORAL_TABLET | Freq: Every day | ORAL | 3 refills | Status: DC

## 2017-04-10 NOTE — Telephone Encounter (Signed)
Patient states they are not as high as they have been, she has been cutting back on the sodium. 1 week ago it was 125/90.

## 2017-04-10 NOTE — Telephone Encounter (Signed)
Sent to pharmacy. Please check to see what her blood pressures have been running. Thanks.

## 2017-04-10 NOTE — Telephone Encounter (Signed)
Pt has requested a medication refill for amlodipine [facility]

## 2017-04-10 NOTE — Telephone Encounter (Signed)
Last OV 07/09/18last filled amplodipine 5 mg 11/07/16 90 0rf patient was increased to 10 mg

## 2017-04-16 ENCOUNTER — Ambulatory Visit (INDEPENDENT_AMBULATORY_CARE_PROVIDER_SITE_OTHER): Payer: 59 | Admitting: Family Medicine

## 2017-04-16 ENCOUNTER — Encounter: Payer: Self-pay | Admitting: Family Medicine

## 2017-04-16 VITALS — BP 140/92 | HR 80 | Temp 99.0°F | Wt 139.6 lb

## 2017-04-16 DIAGNOSIS — K219 Gastro-esophageal reflux disease without esophagitis: Secondary | ICD-10-CM

## 2017-04-16 DIAGNOSIS — M25532 Pain in left wrist: Secondary | ICD-10-CM

## 2017-04-16 DIAGNOSIS — G2581 Restless legs syndrome: Secondary | ICD-10-CM | POA: Diagnosis not present

## 2017-04-16 DIAGNOSIS — J309 Allergic rhinitis, unspecified: Secondary | ICD-10-CM | POA: Diagnosis not present

## 2017-04-16 DIAGNOSIS — I10 Essential (primary) hypertension: Secondary | ICD-10-CM

## 2017-04-16 MED ORDER — HYDROCHLOROTHIAZIDE 12.5 MG PO TABS: 12.5000 mg | ORAL_TABLET | Freq: Every day | ORAL | 1 refills | Status: DC

## 2017-04-16 NOTE — Assessment & Plan Note (Signed)
Symptoms consistent with allergies. She'll trial Flonase.

## 2017-04-16 NOTE — Assessment & Plan Note (Signed)
Above goal. Hasn't really improved with amlodipine. We'll discontinue this and start on HCTZ. Check lab work today. Recheck blood pressure and lab work in one month.

## 2017-04-16 NOTE — Assessment & Plan Note (Signed)
Currently asymptomatic. Possibly carpal tunnel. 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 If she has recurrent issues with this consider sports medicine evaluation.
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
## 2017-04-16 NOTE — Assessment & Plan Note (Signed)
Has improved. She did not have iron deficiency previously though ferritin was not as high as we would like given her symptoms. She started on iron supplement and has had improvement in her symptoms. Asymptomatic currently. She'll discontinue iron and if symptoms recur she'll let us know so we can recheck.

## 2017-04-16 NOTE — Assessment & Plan Note (Signed)
Asymptomatic.  Monitor for recurrence. 

## 2017-04-16 NOTE — Patient Instructions (Addendum)
Nice to see you. Please try discontinuing or iron supplement. If your restless leg symptoms return please let us know. We will change you to hydrochlorothiazide for your blood pressure. Please discontinue the amlodipine.  Please start using Flonase daily for your allergies symptoms. Please start wearing her braces for her wrist while you're typing.

## 2017-04-18 ENCOUNTER — Other Ambulatory Visit: Payer: Self-pay | Admitting: Family Medicine

## 2017-04-18 DIAGNOSIS — E876 Hypokalemia: Secondary | ICD-10-CM

## 2017-04-18 LAB — BASIC METABOLIC PANEL
BUN/Creatinine Ratio: 10 (ref 9–23)
BUN: 8 mg/dL (ref 6–24)
CHLORIDE: 100 mmol/L (ref 96–106)
CO2: 18 mmol/L — AB (ref 20–29)
Calcium: 9.5 mg/dL (ref 8.7–10.2)
Creatinine, Ser: 0.77 mg/dL (ref 0.57–1.00)
GFR calc Af Amer: 110 mL/min/{1.73_m2} (ref >59)
GFR, EST NON AFRICAN AMERICAN: 96 mL/min/{1.73_m2} (ref >59)
GLUCOSE: 80 mg/dL (ref 65–99)
POTASSIUM: 3.4 mmol/L — AB (ref 3.5–5.2)
SODIUM: 138 mmol/L (ref 134–144)

## 2017-04-19 ENCOUNTER — Other Ambulatory Visit (INDEPENDENT_AMBULATORY_CARE_PROVIDER_SITE_OTHER): Payer: 59

## 2017-04-19 DIAGNOSIS — E876 Hypokalemia: Secondary | ICD-10-CM

## 2017-04-21 LAB — BASIC METABOLIC PANEL
BUN/Creatinine Ratio: 13 (ref 9–23)
BUN: 11 mg/dL (ref 6–24)
CALCIUM: 9.8 mg/dL (ref 8.7–10.2)
CHLORIDE: 102 mmol/L (ref 96–106)
CO2: 24 mmol/L (ref 20–29)
Creatinine, Ser: 0.87 mg/dL (ref 0.57–1.00)
GFR calc Af Amer: 95 mL/min/{1.73_m2} (ref >59)
GFR calc non Af Amer: 82 mL/min/{1.73_m2} (ref >59)
Glucose: 91 mg/dL (ref 65–99)
POTASSIUM: 3.6 mmol/L (ref 3.5–5.2)
Sodium: 143 mmol/L (ref 134–144)

## 2017-04-22 ENCOUNTER — Telehealth: Payer: Self-pay | Admitting: Family Medicine

## 2017-04-22 DIAGNOSIS — I10 Essential (primary) hypertension: Secondary | ICD-10-CM

## 2017-04-22 NOTE — Telephone Encounter (Signed)
Pt called wanting to know if Dr.Sonnenbverg wanted her to continue taking the secondary blood pressure pill, and does she need to take a potassium supplement. Please advise, thank you!  Call pt @ [phone]

## 2017-04-22 NOTE — Telephone Encounter (Signed)
Please advise 

## 2017-04-22 NOTE — Telephone Encounter (Signed)
She does not need to take a potassium supplement. She should be taking hydrochlorothiazide. She should've discontinued her amlodipine. Confirm those things with the patient. Thanks.

## 2017-04-23 NOTE — Telephone Encounter (Signed)
Ordered

## 2017-04-23 NOTE — Telephone Encounter (Signed)
It certainly could cause her potassium to go low. We will need to recheck in one month. Please get her scheduled. Lab is already ordered.

## 2017-04-23 NOTE — Telephone Encounter (Signed)
Patient states she is a [employer] employee and she would like an order put into the computer for this

## 2017-04-23 NOTE — Addendum Note (Signed)
Addended by: Glori Luis on: 04/23/2017 07:03 PM   Modules accepted: Orders

## 2017-04-23 NOTE — Telephone Encounter (Signed)
Patient notified and states she was just concerned the hydrochlorothiazide would cause her potassium to get low again, informed patient we will periodically check her potassium. She has discontinued her amlodipine and is taking hydrochlorothiazide

## 2017-05-08 ENCOUNTER — Telehealth: Payer: Self-pay | Admitting: Family Medicine

## 2017-05-08 NOTE — Telephone Encounter (Signed)
Pt would like to have lab orders put into the system for her. She states she is having lower back pain. Her number is [phone].

## 2017-05-08 NOTE — Telephone Encounter (Signed)
Agree. Patient will need to be evaluated to see if she needs other evaluation. Thanks.

## 2017-05-08 NOTE — Telephone Encounter (Signed)
Patient states she would like to have labs done to check her kidneys, she is having low back pain since Monday. Informed patient she would need to be evaluated for this. Scheduled patient with [facility] [place] on 05/09/17

## 2017-05-09 ENCOUNTER — Encounter: Payer: Self-pay | Admitting: Internal Medicine

## 2017-05-09 ENCOUNTER — Ambulatory Visit (INDEPENDENT_AMBULATORY_CARE_PROVIDER_SITE_OTHER): Payer: 59 | Admitting: Internal Medicine

## 2017-05-09 VITALS — BP 138/94 | HR 81 | Temp 98.2°F | Wt 140.0 lb

## 2017-05-09 DIAGNOSIS — R109 Unspecified abdominal pain: Secondary | ICD-10-CM

## 2017-05-09 LAB — POC URINALSYSI DIPSTICK (AUTOMATED)
Bilirubin, UA: NEGATIVE
Glucose, UA: NEGATIVE
Ketones, UA: NEGATIVE
LEUKOCYTES UA: NEGATIVE
NITRITE UA: NEGATIVE
PH UA: 6 (ref 5.0–8.0)
Protein, UA: NEGATIVE
Spec Grav, UA: 1.025 (ref 1.010–1.025)
UROBILINOGEN UA: 0.2 U/dL

## 2017-05-09 MED ORDER — NAPROXEN 375 MG PO TABS: 375.0000 mg | ORAL_TABLET | Freq: Two times a day (BID) | ORAL | 0 refills | Status: DC

## 2017-05-09 NOTE — Patient Instructions (Signed)
Flank Pain Flank pain is pain in your side. The flank is the area of your side between your upper belly (abdomen) and your back. The pain may occur over a short period of time (acute) or may be long-term or come back often (chronic). It may be mild or very bad. Pain in this area can be caused by many different things. Follow these instructions at home:  Rest as told by your doctor.  Drink enough fluid to keep your pee (urine) clear or pale yellow.  Take over-the-counter and prescription medicines only as told by your doctor.  Keep all follow-up visits as told by your doctor. This is important. Contact a doctor if:  Medicine does not help your pain.  You have new symptoms.  Your pain gets worse.  You have a fever.  Your symptoms last longer than 2-3 days. Get help right away if:  Your tummy hurts or is swollen.  You are short of breath.  You feel sick to your stomach (nauseous) and it does not go away.  You cannot stop throwing up (vomiting).  You feel like you will pass out or you do pass out (faint).  You have blood in your pee.  You have a fever and your symptoms suddenly get worse. This information is not intended to replace advice given to you by your health care provider. Make sure you discuss any questions you have with your health care provider. Document Released: 04/03/2008 Document Revised: 03/16/2016 Document Reviewed: 03/29/2015 Elsevier Interactive Patient Education  2018 Elsevier Inc.  

## 2017-05-09 NOTE — Progress Notes (Signed)
Subjective:    Patient ID: Margaret May, female    DOB: Apr 26, 1976, 41 y.o.   MRN: [id number]  HPI  Pt presents to the clinic today with c/o right lower back pain. She reports this started 3-4 days ago. She describes the pain as sore. The pain does not radiate. The pain is worse when she lays down at night. She denies urinary or vaginal symptoms. She denies constipation. She denies any injury to the area. She has not tried anything OTC for her symptoms. She reports she was started on HCTZ 1 month ago.  Review of Systems      Past Medical History:  Diagnosis Date  . GERD (gastroesophageal reflux disease)   . Hypertension     Current Outpatient Prescriptions  Medication Sig Dispense Refill  . fluticasone (FLONASE) 50 MCG/ACT nasal spray Place 1 spray into both nostrils every other day.    . hydrochlorothiazide (HYDRODIURIL) 12.5 MG tablet Take 1 tablet (12.5 mg total) by mouth daily. 90 tablet 1  . hydrocortisone (ANUSOL-HC) 2.5 % rectal cream Place 1 application rectally 2 (two) times daily. 30 g 0  . ibuprofen (ADVIL,MOTRIN) 800 MG tablet Take 800 mg by mouth every 8 (eight) hours as needed.    . polyethylene glycol powder (GLYCOLAX/MIRALAX) powder Take 1 Container by mouth once.    . naproxen (NAPROSYN) 375 MG tablet Take 1 tablet (375 mg total) by mouth 2 (two) times daily with a meal. 60 tablet 0   No current facility-administered medications for this visit.     No Known Allergies  Family History  Problem Relation Age of Onset  . Hypertension Mother   . Kidney disease Maternal Uncle   . Diabetes Paternal Aunt   . Arthritis Maternal Grandmother   . Hypertension Maternal Grandmother   . Cervical cancer Maternal Grandmother     Social History   Social History  . Marital status: Single    Spouse name: N/A  . Number of children: N/A  . Years of education: N/A   Occupational History  . Not on file.   Social History Main Topics  . Smoking status: Never Smoker   . Smokeless tobacco: Never Used  . Alcohol use No  . Drug use: No  . Sexual activity: Not Currently   Other Topics Concern  . 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  Respiratory: Denies difficulty breathing, shortness of breath, cough or sputum production.   Cardiovascular: Denies chest pain, chest tightness, palpitations or swelling in the hands or feet.  Gastrointestinal: Pt reports right flank pain. Denies abdominal pain, bloating, constipation, diarrhea or blood in the stool.  GU: Denies urgency, frequency, pain with urination, burning sensation, blood in urine, odor or discharge. Musculoskeletal: Denies decrease in range of motion, difficulty with gait, muscle pain or joint pain and swelling.   No other specific complaints in a complete review of systems (except as listed in HPI above).  Objective:   Physical Exam   BP (!) 138/94   Pulse 81   Temp 98.2 F (36.8 C) (Oral)   Wt 140 lb (63.5 kg)   LMP 04/24/2017   SpO2 98%   BMI 25.61  kg/m  Wt Readings from Last 3 Encounters:  05/09/17 140 lb (63.5 kg)  04/16/17 139 lb 9.6 oz (63.3 kg)  01/14/17 137 lb 12.8 oz (62.5 kg)    General: Appears her stated age, well developed, well nourished in NAD. Abdomen: Soft and nontender. Normal bowel sounds. No distention or masses noted. No CVA tenderness noted. Musculoskeletal: Normal flexion, extension and rotation of the spine. No bony tenderness noted over the spine or paralumbar muscles.   BMET    Component Value Date/Time   NA 143 04/19/2017 1553   K 3.6 04/19/2017 1553   CL 102 04/19/2017 1553   CO2 24 04/19/2017 1553   GLUCOSE 91 04/19/2017 1553   GLUCOSE 83 11/03/2015 1612   BUN 11 04/19/2017  1553   CREATININE 0.87 04/19/2017 1553   CALCIUM 9.8 04/19/2017 1553   GFRNONAA 82 04/19/2017 1553   GFRAA 95 04/19/2017 1553    Lipid Panel     Component Value Date/Time   CHOL 128 05/21/2016 0719   TRIG 32 05/21/2016 0719   HDL 45 05/21/2016 0719   LDLCALC 77 05/21/2016 0719    CBC    Component Value Date/Time   WBC 5.4 04/27/2015 0706   WBC 7.4 06/02/2014 1424   RBC 5.18 04/27/2015 0706   HGB 14.3 04/27/2015 0706   HCT 41.4 04/27/2015 0706   PLT 372 04/27/2015 0706   MCV 80 04/27/2015 0706   MCH 27.6 04/27/2015 0706   MCHC 34.5 04/27/2015 0706   RDW 13.8 04/27/2015 0706   LYMPHSABS 2.8 04/27/2015 0706   EOSABS 0.1 04/27/2015 0706   BASOSABS 0.0 04/27/2015 0706    Hgb A1C Lab Results  Component Value Date   HGBA1C 5.4 05/21/2016           Assessment & Plan:   Right Flank Pain:  ? If addition of HCTZ could have precipitated kidney stone. She has no personal history but does have a family history of this Urinalysis: trace blood (not on menses) Will send urine culture eRx for Naproxen 375 mg BID Will obtain renal ultrasound  Return precautions discussed Nicki ReaperBAITY, REGINA, NP

## 2017-05-09 NOTE — Addendum Note (Signed)
Addended by: Roena MaladyEVONTENNO, MELANIE Y on: 05/09/2017 04:32 PM   Modules accepted: Orders

## 2017-05-10 LAB — URINE CULTURE
MICRO NUMBER: 81227440
RESULT: NO GROWTH
SPECIMEN QUALITY: ADEQUATE

## 2017-05-13 ENCOUNTER — Other Ambulatory Visit: Payer: Self-pay | Admitting: Family Medicine

## 2017-05-13 DIAGNOSIS — Z1231 Encounter for screening mammogram for malignant neoplasm of breast: Secondary | ICD-10-CM

## 2017-05-15 ENCOUNTER — Ambulatory Visit: Payer: 59

## 2017-06-07 ENCOUNTER — Ambulatory Visit
Admission: RE | Admit: 2017-06-07 | Discharge: 2017-06-07 | Disposition: A | Discharge status: Home / Self Care | Payer: 59 | Source: Ambulatory Visit | Attending: Family Medicine | Admitting: Family Medicine

## 2017-06-07 DIAGNOSIS — Z1231 Encounter for screening mammogram for malignant neoplasm of breast: Secondary | ICD-10-CM | POA: Diagnosis present

## 2018-03-28 ENCOUNTER — Emergency Department (HOSPITAL_COMMUNITY): Payer: Managed Care, Other (non HMO)

## 2018-03-28 ENCOUNTER — Encounter (HOSPITAL_COMMUNITY): Payer: Self-pay | Admitting: Student

## 2018-03-28 ENCOUNTER — Emergency Department (HOSPITAL_COMMUNITY)
Admission: EM | Admit: 2018-03-28 | Discharge: 2018-03-28 | Disposition: A | Discharge status: Home / Self Care | Payer: Managed Care, Other (non HMO) | Attending: Emergency Medicine | Admitting: Emergency Medicine

## 2018-03-28 ENCOUNTER — Other Ambulatory Visit: Payer: Self-pay

## 2018-03-28 DIAGNOSIS — M79642 Pain in left hand: Secondary | ICD-10-CM | POA: Diagnosis present

## 2018-03-28 DIAGNOSIS — Z79899 Other long term (current) drug therapy: Secondary | ICD-10-CM | POA: Insufficient documentation

## 2018-03-28 DIAGNOSIS — I10 Essential (primary) hypertension: Secondary | ICD-10-CM | POA: Insufficient documentation

## 2018-03-28 DIAGNOSIS — R2232 Localized swelling, mass and lump, left upper limb: Secondary | ICD-10-CM | POA: Diagnosis not present

## 2018-03-28 DIAGNOSIS — M7989 Other specified soft tissue disorders: Secondary | ICD-10-CM

## 2018-03-28 DIAGNOSIS — R2241 Localized swelling, mass and lump, right lower limb: Secondary | ICD-10-CM | POA: Insufficient documentation

## 2018-03-28 LAB — I-STAT CHEM 8, ED
BUN: 8 mg/dL (ref 6–20)
CREATININE: 1 mg/dL (ref 0.44–1.00)
Calcium, Ion: 1.16 mmol/L (ref 1.15–1.40)
Chloride: 101 mmol/L (ref 98–111)
GLUCOSE: 93 mg/dL (ref 70–99)
HCT: 41 % (ref 36.0–46.0)
HEMOGLOBIN: 13.9 g/dL (ref 12.0–15.0)
POTASSIUM: 3.9 mmol/L (ref 3.5–5.1)
Sodium: 138 mmol/L (ref 135–145)
TCO2: 26 mmol/L (ref 22–32)

## 2018-03-28 LAB — I-STAT BETA HCG BLOOD, ED (MC, WL, AP ONLY): I-stat hCG, quantitative: <5 m[IU]/mL (ref <5)

## 2018-03-28 NOTE — ED Provider Notes (Signed)
[facility] EMERGENCY DEPARTMENT Provider Note   CSN: [id number] Arrival date & time: 03/28/18  4098   History   Chief Complaint Chief Complaint  Patient presents with  . Hand Pain    HPI Margaret May is a 42 y.o. female with a hx of GERD and HTN who presents to the ED with complaints of L hand swelling x 3 days. Patient states that 3 days ago she returned from lunch and noted that her L hand was diffusely swollen. She states later that afternoon she noted swelling to the R foot. She relays that the L hand swelling improved, and subsequently worsened and that the R foot swelling has resolved. She has not noted any specific alleviating/aggravating factors. The areas are not painful.  She cannot recall a specific traumatic injury or any change in activity.  She is right-hand dominant.  She was seen at a [facility] yesterday for same, per chart review it was recommended that she discontinue her HCTZ for 1 week, monitor her blood pressure, and follow up with her primary care provider. She is currently prescribed lisinopril/HCTZ combo pill, has not had this since day of onset of sxs, has not called her PCP. She did have a similar episode of swelling to the L foot that spontaneously resolved 1 month ago. Denies fever, chills, redness, pain, warmth, open wounds, or IVDU.  Denies trouble breathing or swallowing or feeling of throat closing. She has not started any new medications or had any new products/exposures with the onset of the swelling this time.  She was changed from amlodipine to the lisinopril/HCTZ combo pill about a month ago.  She is not taking amlodipine anymore. HPI  Past Medical History:  Diagnosis Date  . GERD (gastroesophageal reflux disease)   . Hypertension     Patient Active Problem List   Diagnosis Date Noted  . Allergic rhinitis 04/16/2017  . Left wrist pain 04/16/2017  . Right knee pain 01/14/2017  . Restless leg syndrome 01/14/2017  . Hemorrhoid 11/03/2015    . Palpitations 03/15/2015  . Benign essential HTN 01/26/2015  . Benign paroxysmal positional vertigo 09/26/2014  . GERD (gastroesophageal reflux disease) 09/26/2014  . Anxiety state 09/26/2014    Past Surgical History:  Procedure Laterality Date  . LEEP  2015  . UPPER GI ENDOSCOPY  09/2014     OB History   None      Home Medications    Prior to Admission medications   Medication Sig Start Date End Date Taking? Authorizing Provider  fluticasone (FLONASE) 50 MCG/ACT nasal spray Place 1 spray into both nostrils every other day.    [provider]  hydrochlorothiazide (HYDRODIURIL) 12.5 MG tablet Take 1 tablet (12.5 mg total) by mouth daily. 04/16/17   Glori Luis, MD  hydrocortisone (ANUSOL-HC) 2.5 % rectal cream Place 1 application rectally 2 (two) times daily. 11/03/15   Carollee Leitz, RN  ibuprofen (ADVIL,MOTRIN) 800 MG tablet Take 800 mg by mouth every 8 (eight) hours as needed.    [provider]  naproxen (NAPROSYN) 375 MG tablet Take 1 tablet (375 mg total) by mouth 2 (two) times daily with a meal. 05/09/17   Baity, Salvadore Oxford, NP  polyethylene glycol powder (GLYCOLAX/MIRALAX) powder Take 1 Container by mouth once.    [provider]    Family History Family History  Problem Relation Age of Onset  . Hypertension Mother   . Kidney disease Maternal Uncle   . Diabetes Paternal Aunt   .  Arthritis Maternal Grandmother   . Hypertension Maternal Grandmother   . Cervical cancer Maternal Grandmother     Social History Social History   Tobacco Use  . Smoking status: Never Smoker  . Smokeless tobacco: Never Used  Substance Use Topics  . Alcohol use: No  . Drug use: No     Allergies   Patient has no known allergies.   Review of Systems Review of Systems  Constitutional: Negative for chills and fever.  HENT: Negative for trouble swallowing.   Respiratory: Negative for shortness of breath.   Musculoskeletal:       Positive for left  hand swelling.  Positive for right foot swelling which has resolved.  Skin: Negative for color change, pallor, rash and wound.  Neurological: Negative for weakness and numbness.  All other systems reviewed and are negative.    Physical Exam Updated Vital Signs BP (!) 132/100 (BP Location: Right Arm)   Pulse 84   Temp 97.9 F (36.6 C) (Oral)   Resp 18   Ht 5\' 2"  (1.575 m)   Wt 59.9 kg   SpO2 100%   BMI 24.14 kg/m   Physical Exam  Constitutional: She appears well-developed and well-nourished.  Non-toxic appearance. No distress.  HENT:  Head: Normocephalic and atraumatic.  Patient tolerating her own secretions without difficulty.  Airway is patent.  No stridor.  Eyes: Conjunctivae are normal. Right eye exhibits no discharge. Left eye exhibits no discharge.  Neck: Neck supple.  Cardiovascular: Normal rate and regular rhythm.  2+ symmetric radial pulses.  2+ symmetric DP/PT pulses  Pulmonary/Chest: Effort normal and breath sounds normal. No respiratory distress. She has no wheezes. She has no rhonchi. She has no rales.  Respiration even and unlabored  Abdominal: Soft. She exhibits no distension. There is no tenderness.  Musculoskeletal:  Upper extremities: Patient has diffuse swelling noted to the left hand which does not extend past the wrist area.  There is no overlying erythema, warmth, open wounds, or ecchymosis.  Patient is able to flex and extend the wrist with full range of motion.  She is able to flex and extend all interphalangeal joints and MCPs somewhat, however she is having difficulty with full flexion secondary to swelling.  The upper extremities are nontender to palpation. Lower extremities: No obvious deformity, appreciable swelling, erythema, ecchymosis, open wounds, or warmth.  She has normal range of motion to the joints.  Nontender.  Neurological: She is alert.  Clear speech.  Sensation grossly intact bilateral upper extremities.  Difficult to assess grip strength  secondary to swelling.  Skin: Skin is warm and dry. Capillary refill takes less than 2 seconds. No rash noted.  Psychiatric: She has a normal mood and affect. Her behavior is normal.  Nursing note and vitals reviewed.   ED Treatments / Results  Labs Results for orders placed or performed during the hospital encounter of 03/28/18  I-stat Chem 8, ED  Result Value Ref Range   Sodium 138 135 - 145 mmol/L   Potassium 3.9 3.5 - 5.1 mmol/L   Chloride 101 98 - 111 mmol/L   BUN 8 6 - 20 mg/dL   Creatinine, Ser 1.61 0.44 - 1.00 mg/dL   Glucose, Bld 93 70 - 99 mg/dL   Calcium, Ion 0.96 0.45 - 1.40 mmol/L   TCO2 26 22 - 32 mmol/L   Hemoglobin 13.9 12.0 - 15.0 g/dL   HCT 40.9 81.1 - 91.4 %  I-Stat beta hCG blood, ED  Result Value Ref Range  I-stat hCG, quantitative <5.0 <5 mIU/mL   Comment 3           EKG None  Radiology US Venous Img Upper Left (dvt Study)  Result Date: 03/28/2018 CLINICAL DATA:  Left upper extremity swelling EXAM: LEFT UPPER EXTREMITY VENOUS DOPPLER ULTRASOUND TECHNIQUE: Gray-scale sonography with graded compression, as well as color Doppler and duplex ultrasound were performed to evaluate the upper extremity deep venous system from the level of the subclavian vein and including the jugular, axillary, basilic, radial, ulnar and upper cephalic vein. Spectral Doppler was utilized to evaluate flow at rest and with distal augmentation maneuvers. COMPARISON:  None. FINDINGS: Contralateral Subclavian Vein: Respiratory phasicity is normal and symmetric with the symptomatic side. No evidence of thrombus. Normal compressibility. Internal Jugular Vein: No evidence of thrombus. Normal compressibility, respiratory phasicity and response to augmentation. Subclavian Vein: No evidence of thrombus. Normal compressibility, respiratory phasicity and response to augmentation. Axillary Vein: No evidence of thrombus. Normal compressibility, respiratory phasicity and response to augmentation.  Cephalic Vein: No evidence of thrombus. Normal compressibility, respiratory phasicity and response to augmentation. Basilic Vein: No evidence of thrombus. Normal compressibility, respiratory phasicity and response to augmentation. Brachial Veins: No evidence of thrombus. Normal compressibility, respiratory phasicity and response to augmentation. Radial Veins: No evidence of thrombus. Normal compressibility, respiratory phasicity and response to augmentation. Ulnar Veins: No evidence of thrombus. Normal compressibility, respiratory phasicity and response to augmentation. Venous Reflux:  None visualized. Other Findings:  Mild subcutaneous edema noted in the hand region. IMPRESSION: No evidence of DVT within the left upper extremity. Electronically Signed   By: Judie Petit.  Shick M.D.   On: 03/28/2018 11:16    Procedures Procedures (including critical care time)  Medications Ordered in ED Medications - No data to display   Initial Impression / Assessment and Plan / ED Course  I have reviewed the triage vital signs and the nursing notes.  Pertinent labs & imaging results that were available during my care of the patient were reviewed by me and considered in my medical decision making (see chart for details).   Patient presents to the ED with L hand swelling that has been waxing/waning for past 3 days, she was experiencing R foot swelling, but this has since resolved. On exam she does have diffuse swelling to the L hand which does not appear to extend to the forearm, mild ROM limitation to digits with flexion secondary to swelling, NVI distally. Patient is afebrile, no overlying erythema/warmth to raise concern for infectious etiology such as abscess, cellulitis, osteomyelitis, or septic joint. No traumatic injury reported to raise concern for fracture/dislocation.  The swelling is not isolated to a specific joint and it is not painful, therefore feel gout is less likely. She does not have any new exposures that  she is aware of, she is not having any respiratory distress, airway is patent, do not suspect anaphylactic type reaction.  Chem 8 obtained- renal function appears at fluctuating baseline for the patient upon review of prior labs. No significant electrolyte disturbance. Venous duplex study of the left upper extremity is negative for DVT.  Etiology to patient's symptoms is unclear, however do not suspect emergent process at this time, we recommend that she call her primary care office to further discuss this, for repeat evaluation, and for further management. Recommended trial of ice and ibuprofen. Monitor BP at home.  I discussed results, treatment plan, need for PCP follow-up, and return precautions with the patient. Provided opportunity for questions, patient confirmed understanding  and is in agreement with plan.   Findings and plan of care discussed with supervising physician Dr. Jacqulyn BathLong- in agreement.   Final Clinical Impressions(s) / ED Diagnoses   Final diagnoses:  Swelling of left hand    ED Discharge Orders    None       Cherly Andersonetrucelli, Litsy Epting R, PA-C 03/28/18 1232    Maia PlanLong, Joshua G, MD 03/28/18 1505

## 2018-03-28 NOTE — Discharge Instructions (Addendum)
You were seen in the emergency department today for swelling to your left hand.  We did some basic lab work, it appears that your kidney function and electrolytes are normal.  We did an ultrasound to check for a blood clot in the left arm, this was negative, there was no evidence of a blood clot.  We are unsure of the exact cause of the swelling to your hand, we would like you to try ice 20 minutes on 40 minutes off for the next 24 hours, please be sure to wrap the ice in a towel and do not apply directly to the skin.  You may also try ibuprofen per over-the-counter dosing to help with swelling as well.  We are unsure of what the cause of this is we would like you to follow-up closely with your primary care provider.  Please call their office to schedule an appointment within the next 3 days.  Return to the ER for new or worsening symptoms or any other concerns.

## 2018-03-28 NOTE — ED Triage Notes (Addendum)
Pt was seen at urgent yesterday for left hand and right foot  swelling. Pt states they did not know why hand was swollen and was told to come to ER.  Pt just recently had BP med changed to lisinopril x 1 month ago which is when the swelling started Pt did not take med today.

## 2018-05-14 ENCOUNTER — Other Ambulatory Visit: Payer: Self-pay | Admitting: Family Medicine

## 2018-05-14 DIAGNOSIS — Z1231 Encounter for screening mammogram for malignant neoplasm of breast: Secondary | ICD-10-CM

## 2019-04-28 NOTE — Telephone Encounter (Signed)
Error

## 2019-11-22 ENCOUNTER — Other Ambulatory Visit: Payer: Self-pay

## 2019-11-22 ENCOUNTER — Emergency Department
Admission: EM | Admit: 2019-11-22 | Discharge: 2019-11-22 | Disposition: A | Discharge status: Home / Self Care | Payer: Managed Care, Other (non HMO) | Attending: Emergency Medicine | Admitting: Emergency Medicine

## 2019-11-22 ENCOUNTER — Emergency Department: Payer: Managed Care, Other (non HMO)

## 2019-11-22 DIAGNOSIS — M5412 Radiculopathy, cervical region: Secondary | ICD-10-CM | POA: Insufficient documentation

## 2019-11-22 DIAGNOSIS — Z79899 Other long term (current) drug therapy: Secondary | ICD-10-CM | POA: Diagnosis not present

## 2019-11-22 DIAGNOSIS — I10 Essential (primary) hypertension: Secondary | ICD-10-CM | POA: Diagnosis not present

## 2019-11-22 DIAGNOSIS — R0789 Other chest pain: Secondary | ICD-10-CM | POA: Insufficient documentation

## 2019-11-22 DIAGNOSIS — H9202 Otalgia, left ear: Secondary | ICD-10-CM | POA: Diagnosis not present

## 2019-11-22 DIAGNOSIS — M62838 Other muscle spasm: Secondary | ICD-10-CM | POA: Insufficient documentation

## 2019-11-22 DIAGNOSIS — M542 Cervicalgia: Secondary | ICD-10-CM | POA: Diagnosis present

## 2019-11-22 LAB — BASIC METABOLIC PANEL
Anion gap: 9 (ref 5–15)
BUN: 12 mg/dL (ref 6–20)
CO2: 27 mmol/L (ref 22–32)
Calcium: 9.5 mg/dL (ref 8.9–10.3)
Chloride: 102 mmol/L (ref 98–111)
Creatinine, Ser: 0.85 mg/dL (ref 0.44–1.00)
GFR calc Af Amer: >60 mL/min (ref >60)
GFR calc non Af Amer: >60 mL/min (ref >60)
Glucose, Bld: 80 mg/dL (ref 70–99)
Potassium: 3.3 mmol/L — ABNORMAL LOW (ref 3.5–5.1)
Sodium: 138 mmol/L (ref 135–145)

## 2019-11-22 LAB — CBC
HCT: 44.5 % (ref 36.0–46.0)
Hemoglobin: 14.5 g/dL (ref 12.0–15.0)
MCH: 26.8 pg (ref 26.0–34.0)
MCHC: 32.6 g/dL (ref 30.0–36.0)
MCV: 82.1 fL (ref 80.0–100.0)
Platelets: 302 10*3/uL (ref 150–400)
RBC: 5.42 MIL/uL — ABNORMAL HIGH (ref 3.87–5.11)
RDW: 14 % (ref 11.5–15.5)
WBC: 6.9 10*3/uL (ref 4.0–10.5)
nRBC: 0 % (ref 0.0–0.2)

## 2019-11-22 LAB — TROPONIN I (HIGH SENSITIVITY)
Troponin I (High Sensitivity): 4 ng/L (ref <18)
Troponin I (High Sensitivity): 3 ng/L (ref <18)

## 2019-11-22 MED ORDER — MELOXICAM 15 MG PO TABS
15.0000 mg | ORAL_TABLET | Freq: Every day | ORAL | 0 refills | Status: DC
Start: 2019-11-22 — End: 2023-12-29

## 2019-11-22 MED ORDER — METHOCARBAMOL 500 MG PO TABS: 500.0000 mg | ORAL_TABLET | Freq: Four times a day (QID) | ORAL | 0 refills | Status: DC

## 2019-11-22 MED ORDER — KETOROLAC TROMETHAMINE 30 MG/ML IJ SOLN
30.0000 mg | Freq: Once | INTRAMUSCULAR | Status: AC
  Administered 2019-11-22: 30 mg via INTRAMUSCULAR
  Filled 2019-11-22: qty 1

## 2019-11-22 MED ORDER — ORPHENADRINE CITRATE 30 MG/ML IJ SOLN
60.0000 mg | Freq: Once | INTRAMUSCULAR | Status: AC
  Administered 2019-11-22: 60 mg via INTRAMUSCULAR
  Filled 2019-11-22: qty 2

## 2019-11-22 NOTE — ED Provider Notes (Signed)
[facility] Emergency Department Provider Note  ____________________________________________  Time seen: Approximately 4:21 PM  I have reviewed the triage vital signs and the nursing notes.   HISTORY  Chief Complaint Arm Pain, Otalgia, and Chest Pain    HPI Margaret May is a 44 y.o. female patient presented to emergency department complaining of left-sided neck, left ear pain, left rib pain, left arm pain.  Patient states that she woke up this morning had a numbness/tingling sensation in the left arm and felt like she did not have good grip strength to the left hand.  She is experiencing pain to the left ear, left neck, left chest region.  She describes it as an aching, burning, numbness and tingling sensation.  No trauma to the head or neck or chest.  Patient has no cardiac history.  She states that symptoms have improved somewhat.  They are not fully alleviated but definitely have improved.  Patient with no headache, visual changes, fevers or chills, URI symptoms, shortness of breath, abdominal pain, nausea or vomiting.         Past Medical History:  Diagnosis Date  . GERD (gastroesophageal reflux disease)   . Hypertension     Patient Active Problem List   Diagnosis Date Noted  . Allergic rhinitis 04/16/2017  . Left wrist pain 04/16/2017  . Right knee pain 01/14/2017  . Restless leg syndrome 01/14/2017  . Hemorrhoid 11/03/2015  . Palpitations 03/15/2015  . Benign essential HTN 01/26/2015  . Benign paroxysmal positional vertigo 09/26/2014  . GERD (gastroesophageal reflux disease) 09/26/2014  . Anxiety state 09/26/2014    Past Surgical History:  Procedure Laterality Date  . LEEP  2015  . UPPER GI ENDOSCOPY  09/2014    Prior to Admission medications   Medication Sig Start Date End Date Taking? Authorizing Provider  amLODipine (NORVASC) 10 MG tablet Take 10 mg by mouth daily.    [provider]  fluticasone (FLONASE) 50 MCG/ACT  nasal spray Place 1 spray into both nostrils every other day.    [provider]  ibuprofen (ADVIL,MOTRIN) 800 MG tablet Take 800 mg by mouth every 8 (eight) hours as needed.    [provider]  lisinopril-hydrochlorothiazide (PRINZIDE,ZESTORETIC) 10-12.5 MG tablet Take 1 tablet by mouth daily. 02/06/18   [provider]  meloxicam (MOBIC) 15 MG tablet Take 1 tablet (15 mg total) by mouth daily. 11/22/19   Preciosa Bundrick, Delorise Royals, PA-C  methocarbamol (ROBAXIN) 500 MG tablet Take 1 tablet (500 mg total) by mouth 4 (four) times daily. 11/22/19   Angelina Venard, Delorise Royals, PA-C  ranitidine (ZANTAC) 150 MG tablet Take 1 tablet by mouth daily as needed.    [provider]    Allergies Patient has no known allergies.  Family History  Problem Relation Age of Onset  . Hypertension Mother   . Kidney disease Maternal Uncle   . Diabetes Paternal Aunt   . Arthritis Maternal Grandmother   . Hypertension Maternal Grandmother   . Cervical cancer Maternal Grandmother     Social History Social History   Tobacco Use  . Smoking status: Never Smoker  . Smokeless tobacco: Never Used  Substance Use Topics  . Alcohol use: No  . Drug use: No     Review of Systems  Constitutional: No fever/chills Eyes: No visual changes. No discharge ENT: Positive for left ear pain Cardiovascular: no chest pain. Respiratory: no cough. No SOB. Gastrointestinal: No abdominal pain.  No nausea, no vomiting.  No diarrhea.  No constipation. Genitourinary: Negative for dysuria. No hematuria Musculoskeletal: Positive for left-sided neck pain, left chest wall pain, numbness and tingling of the left arm. Skin: Negative for rash, abrasions, lacerations, ecchymosis. Neurological: Negative for headaches, focal weakness or numbness. 10-point ROS otherwise negative.  ____________________________________________   PHYSICAL EXAM:  VITAL SIGNS: ED Triage Vitals  Enc Vitals Group     BP 11/22/19  1235 (!) 174/111     Pulse Rate 11/22/19 1235 78     Resp 11/22/19 1235 18     Temp 11/22/19 1235 98.4 F (36.9 C)     Temp Source 11/22/19 1235 Oral     SpO2 11/22/19 1235 100 %     Weight 11/22/19 1236 130 lb (59 kg)     Height 11/22/19 1236 5\' 2"  (1.575 m)     Head Circumference --      Peak Flow --      Pain Score 11/22/19 1235 6     Pain Loc --      Pain Edu? --      Excl. in [place]? --      Constitutional: Alert and oriented. Well appearing and in no acute distress. Eyes: Conjunctivae are normal. PERRL. EOMI. Head: Atraumatic. ENT:      Ears: EACs and TMs unremarkable bilaterally.      Nose: No congestion/rhinnorhea.      Mouth/Throat: Mucous membranes are moist.  Neck: No stridor.  No midline cervical spine tenderness to palpation. Hematological/Lymphatic/Immunilogical: No cervical lymphadenopathy. Cardiovascular: Normal rate, regular rhythm. Normal S1 and S2.  Good peripheral circulation. Respiratory: Normal respiratory effort without tachypnea or retractions. Lungs CTAB. Good air entry to the bases with no decreased or absent breath sounds. Gastrointestinal: Bowel sounds 4 quadrants. Soft and nontender to palpation. No guarding or rigidity. No palpable masses. No distention. No CVA tenderness. Musculoskeletal: Full range of motion to all extremities. No gross deformities appreciated.  Visualization of the left neck, left arm, left chest wall reveals no visible signs of trauma or other acute abnormality.  Palpation reveals no tenderness midline cervical left paraspinal muscle groups but does have tenderness diffusely along the insertion site of the left trapezius muscle extending to the insertion point on the left occipital region.  No palpable abnormalities or deficits.  No other tenderness to palpation over the structures of the left shoulder.  Full range of motion to left shoulder, left elbow, left wrist.  Radial pulse and sensation intact bilateral upper  extremities. Neurologic:  Normal speech and language. No gross focal neurologic deficits are appreciated.  Cranial nerves II through XII grossly intact. Skin:  Skin is warm, dry and intact. No rash noted. Psychiatric: Mood and affect are normal. Speech and behavior are normal. Patient exhibits appropriate insight and judgement.   ____________________________________________   LABS (all labs ordered are listed, but only abnormal results are displayed)  Labs Reviewed  BASIC METABOLIC PANEL - Abnormal; Notable for the following components:      Result Value   Potassium 3.3 (*)    All other components within normal limits  CBC - Abnormal; Notable for the following components:   RBC 5.42 (*)    All other components within normal limits  POC URINE PREG, ED  TROPONIN I (HIGH SENSITIVITY)  TROPONIN I (HIGH SENSITIVITY)   ____________________________________________  EKG   ____________________________________________  RADIOLOGY I personally viewed and evaluated these images as part of my medical decision making, as well as reviewing the written report by the radiologist.  DG Chest  2 View  Result Date: 11/22/2019 CLINICAL DATA:  Pt states that for the past 3 days she has been having pain in the left arm, states that the pain is under her left ear, left side of neck, into her left arm, and under her left breast, denies any known injury, states that this am when she woke up she couldn't use her left hand, it wouldn't open, denies lying on that side. Hx HTN, Nonsmoker EXAM: CHEST - 2 VIEW COMPARISON:  None. FINDINGS: The heart size and mediastinal contours are within normal limits. Both lungs are clear. No pleural effusion or pneumothorax. The visualized skeletal structures are unremarkable. IMPRESSION: No active cardiopulmonary disease. Electronically Signed   By: Amie Portland M.D.   On: 11/22/2019 13:41    ____________________________________________    PROCEDURES  Procedure(s)  performed:    Procedures    Medications  ketorolac (TORADOL) 30 MG/ML injection 30 mg (has no administration in time range)  orphenadrine (NORFLEX) injection 60 mg (has no administration in time range)     ____________________________________________   INITIAL IMPRESSION / ASSESSMENT AND PLAN / ED COURSE  Pertinent labs & imaging results that were available during my care of the patient were reviewed by me and considered in my medical decision making (see chart for details).  Review of the [place] CSRS was performed in accordance of the [facility] prior to dispensing any controlled drugs.           Patient's diagnosis is consistent with trapezius muscle spasm and cervical radiculopathy.  Patient presented to emergency department with pain radiating from the posterior shoulder along the ribs, into the neck and down the arm.  Physical exam was concerning for musculoskeletal involvement but given the pattern differential included ACS/STEMI, bronchitis, pneumonia, pneumothorax, PE, cervical radiculopathy, URI, otitis media.  Findings on physical exam, labs, imaging is most consistent with musculoskeletal symptoms including trapezius muscle spasm and cervical radiculopathy.  Patient will be given anti-inflammatory muscle relaxer here in the emergency department.. Patient will be discharged home with prescriptions for Toradol and Norflex. Patient is to follow up with primary care as needed or otherwise directed. Patient is given ED precautions to return to the ED for any worsening or new symptoms.     ____________________________________________  FINAL CLINICAL IMPRESSION(S) / ED DIAGNOSES  Final diagnoses:  Trapezius muscle spasm  Cervical radiculopathy      NEW MEDICATIONS STARTED DURING THIS VISIT:  ED Discharge Orders         Ordered    meloxicam (MOBIC) 15 MG tablet  Daily     11/22/19 1825    methocarbamol (ROBAXIN) 500 MG tablet  4 times daily     11/22/19 1825               This chart was dictated using voice recognition software/Dragon. Despite best efforts to proofread, errors can occur which can change the meaning. Any change was purely unintentional.    Racheal Patches, PA-C 11/22/19 1827    Charlynne Pander, MD 11/22/19 [phone]

## 2019-11-22 NOTE — ED Triage Notes (Signed)
Pt states that for the past 3 days she has been having pain in the left arm, states that the pain is under her left ear, left side of neck, into her left arm, and under her left breast, denies any known injury, states that this am when she woke up she couldn't use her left hand, it wouldn't open, denies lying on that side

## 2020-05-04 ENCOUNTER — Other Ambulatory Visit: Payer: Self-pay | Admitting: Obstetrics & Gynecology

## 2020-05-04 DIAGNOSIS — Z1231 Encounter for screening mammogram for malignant neoplasm of breast: Secondary | ICD-10-CM

## 2020-06-06 ENCOUNTER — Ambulatory Visit
Admission: RE | Admit: 2020-06-06 | Discharge: 2020-06-06 | Disposition: A | Discharge status: Home / Self Care | Payer: Managed Care, Other (non HMO) | Source: Ambulatory Visit | Attending: Obstetrics & Gynecology | Admitting: Obstetrics & Gynecology

## 2020-06-06 ENCOUNTER — Other Ambulatory Visit: Payer: Self-pay

## 2020-06-06 DIAGNOSIS — Z1231 Encounter for screening mammogram for malignant neoplasm of breast: Secondary | ICD-10-CM | POA: Insufficient documentation

## 2021-05-05 ENCOUNTER — Other Ambulatory Visit: Payer: Self-pay | Admitting: Obstetrics and Gynecology

## 2021-05-05 DIAGNOSIS — Z1231 Encounter for screening mammogram for malignant neoplasm of breast: Secondary | ICD-10-CM

## 2021-06-08 ENCOUNTER — Ambulatory Visit
Admission: RE | Admit: 2021-06-08 | Discharge: 2021-06-08 | Disposition: A | Discharge status: Home / Self Care | Payer: Managed Care, Other (non HMO) | Source: Ambulatory Visit | Attending: Obstetrics and Gynecology | Admitting: Obstetrics and Gynecology

## 2021-06-08 ENCOUNTER — Other Ambulatory Visit: Payer: Self-pay

## 2021-06-08 DIAGNOSIS — Z1231 Encounter for screening mammogram for malignant neoplasm of breast: Secondary | ICD-10-CM | POA: Diagnosis present

## 2021-12-29 ENCOUNTER — Other Ambulatory Visit: Payer: Self-pay | Admitting: Internal Medicine

## 2022-01-02 LAB — SURGICAL PATHOLOGY

## 2022-05-25 ENCOUNTER — Other Ambulatory Visit: Payer: Self-pay | Admitting: Obstetrics and Gynecology

## 2022-05-25 DIAGNOSIS — Z1231 Encounter for screening mammogram for malignant neoplasm of breast: Secondary | ICD-10-CM

## 2022-06-14 ENCOUNTER — Ambulatory Visit
Admission: RE | Admit: 2022-06-14 | Discharge: 2022-06-14 | Disposition: A | Discharge status: Home / Self Care | Payer: Managed Care, Other (non HMO) | Source: Ambulatory Visit | Attending: Obstetrics and Gynecology | Admitting: Obstetrics and Gynecology

## 2022-06-14 DIAGNOSIS — Z1231 Encounter for screening mammogram for malignant neoplasm of breast: Secondary | ICD-10-CM | POA: Insufficient documentation

## 2023-02-09 IMAGING — MG MM DIGITAL SCREENING BILAT W/ TOMO AND CAD
8 series · 9 of 24 positions shown · non-contrast
Comparison: Previous exam(s).

CLINICAL DATA: Screening.

EXAM:
DIGITAL SCREENING BILATERAL MAMMOGRAM WITH TOMOSYNTHESIS AND CAD
TECHNIQUE: Bilateral screening digital craniocaudal and mediolateral oblique
mammograms were obtained. Bilateral screening digital breast
tomosynthesis was performed. The images were evaluated with
computer-aided detection.

[L MLO synth-2D]
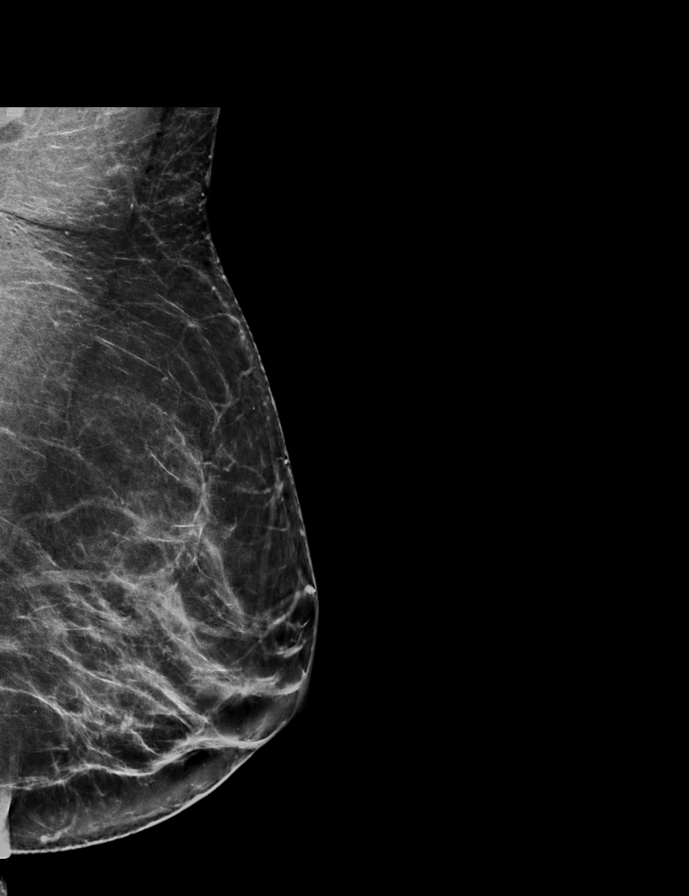

[L CC synth-2D]
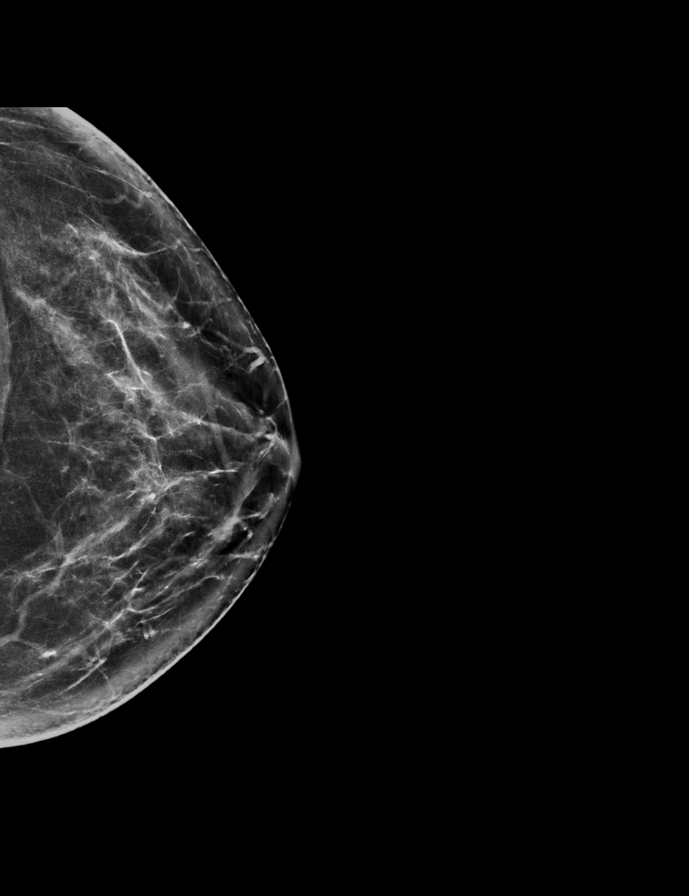

[R CC synth-2D]
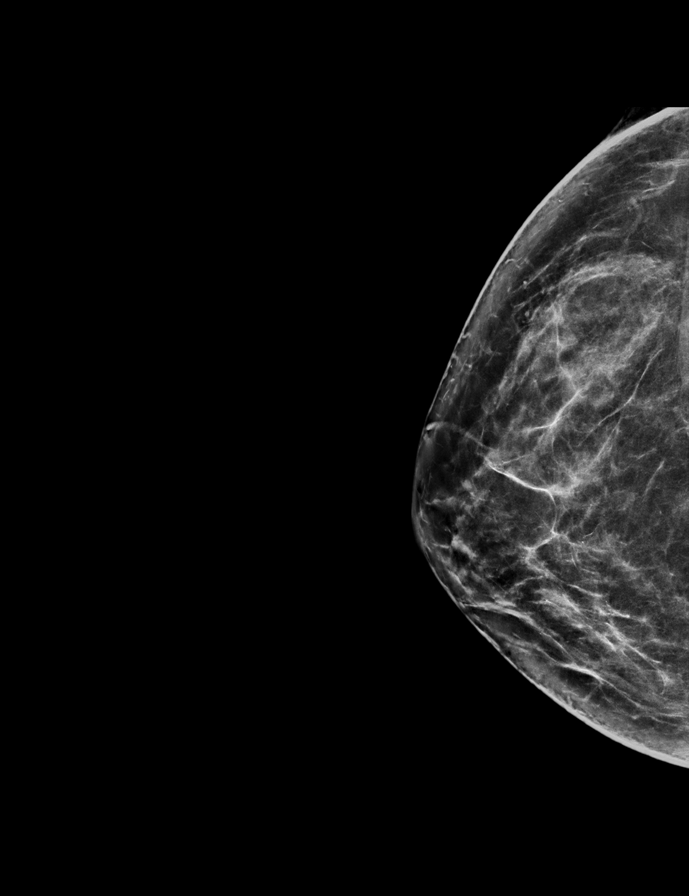

[R MLO synth-2D]
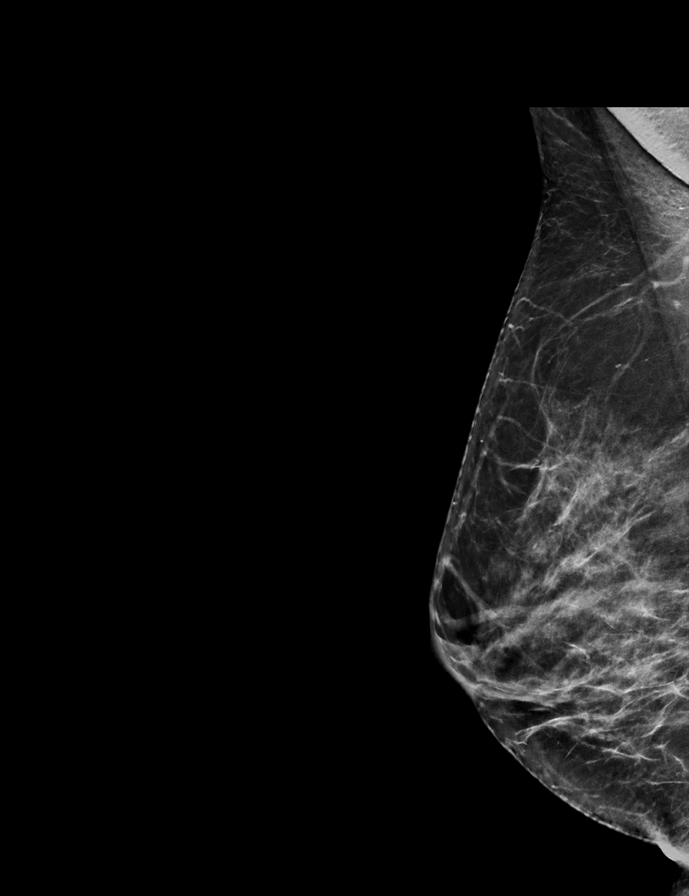

[R CC tomo · 2 of 68 frames shown]
[frame 22/68]
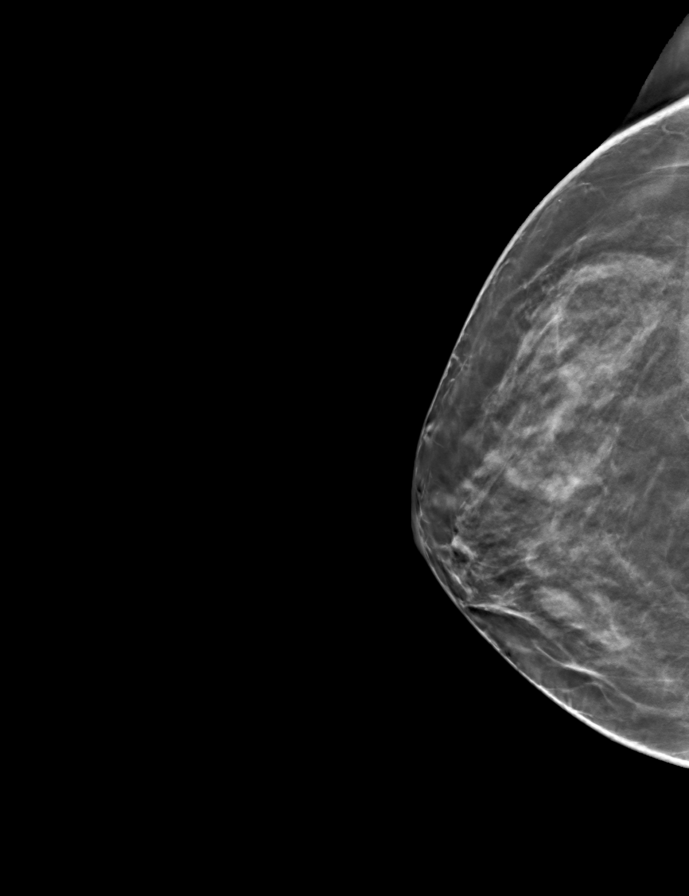
[frame 35/68]
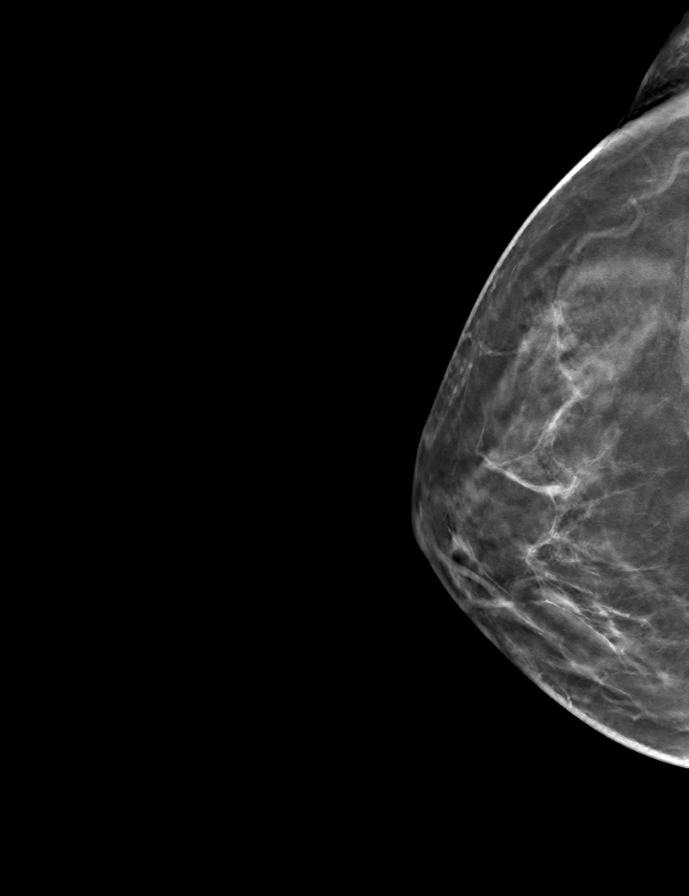

[L MLO tomo · tomo slice 35/70.0]
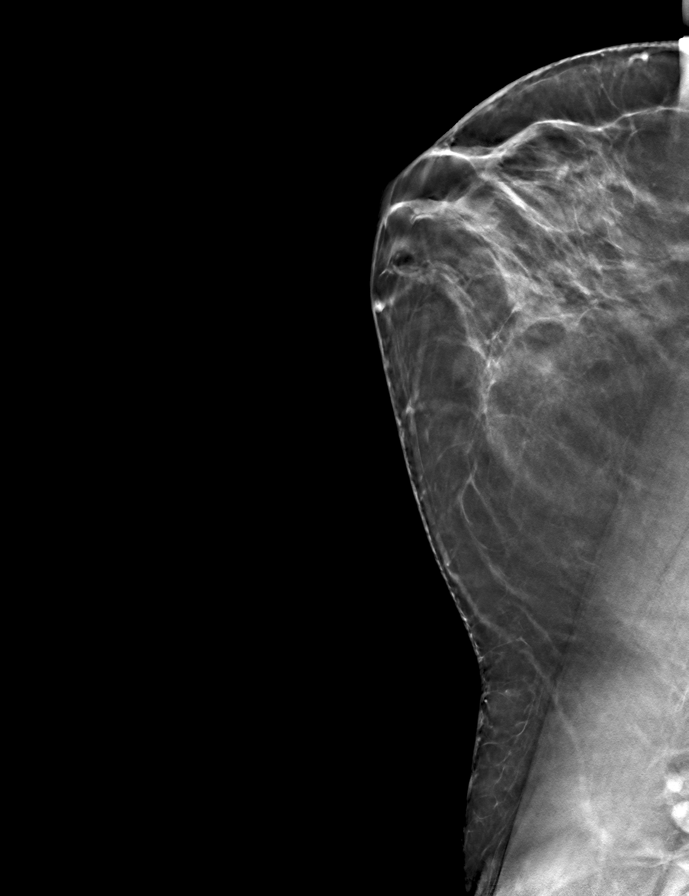

[R MLO tomo · tomo slice 33/66.0]
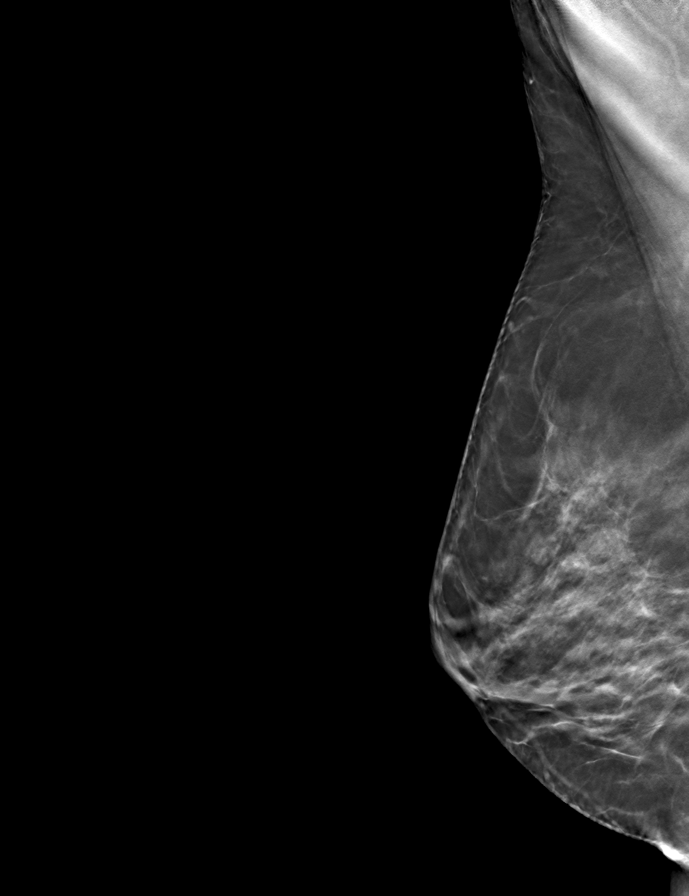

[L CC tomo · tomo slice 35/68.0]
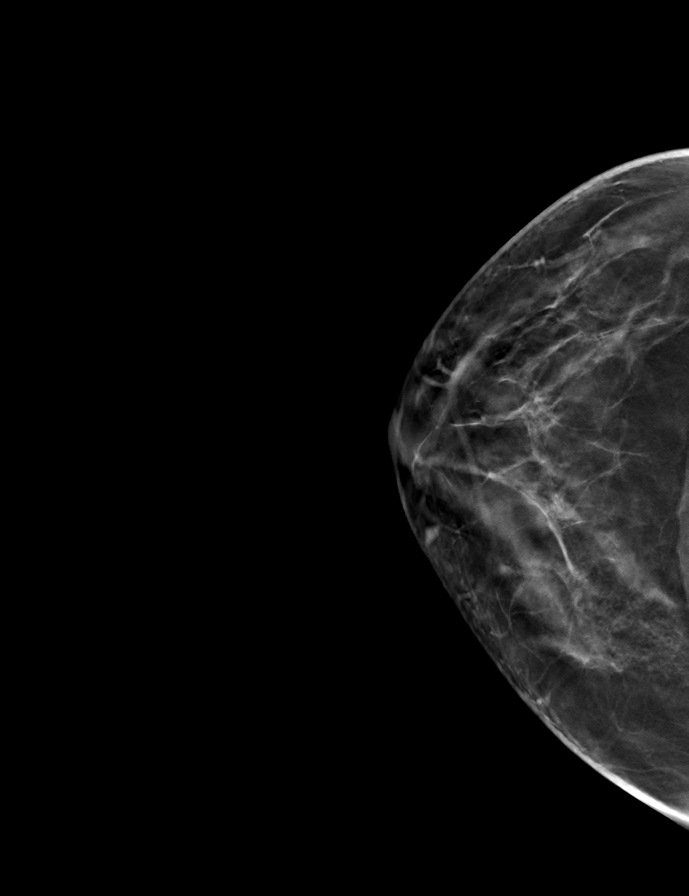

[9 of 24 positions shown; findings below may reference images not displayed]

ACR Breast Density Category c: The breast tissue is heterogeneously
dense, which may obscure small masses.
FINDINGS: There are no findings suspicious for malignancy.
IMPRESSION: No mammographic evidence of malignancy. A result letter of this
screening mammogram will be mailed directly to the patient.

RECOMMENDATION:
Screening mammogram in one year. (Code:Q3-W-BC3)

BI-RADS CATEGORY  1: Negative.

## 2023-05-15 ENCOUNTER — Other Ambulatory Visit: Payer: Self-pay | Admitting: Obstetrics and Gynecology

## 2023-05-15 DIAGNOSIS — Z1231 Encounter for screening mammogram for malignant neoplasm of breast: Secondary | ICD-10-CM

## 2023-06-17 ENCOUNTER — Ambulatory Visit
Admission: RE | Admit: 2023-06-17 | Discharge: 2023-06-17 | Disposition: A | Discharge status: Home / Self Care | Payer: Managed Care, Other (non HMO) | Source: Ambulatory Visit | Attending: Obstetrics and Gynecology | Admitting: Obstetrics and Gynecology

## 2023-06-17 DIAGNOSIS — Z1231 Encounter for screening mammogram for malignant neoplasm of breast: Secondary | ICD-10-CM | POA: Diagnosis present

## 2023-12-09 NOTE — Therapy (Signed)
 OUTPATIENT OCCUPATIONAL THERAPY ORTHO EVALUATION  Patient Name: Margaret May MRN: [id number] DOB:04/15/1976, 48 y.o., female Today's Date: 12/10/2023  PCP: Dr Tish Forge PROVIDER: Dr Mozell Arias  END OF SESSION:  OT End of Session - 12/10/23 1623     Visit Number 1    Number of Visits 10    Date for OT Re-Evaluation 02/04/24    OT Start Time 1623    OT Stop Time 1708    OT Time Calculation (min) 45 min    Activity Tolerance Patient tolerated treatment well    Behavior During Therapy [facility] for tasks assessed/performed             Past Medical History:  Diagnosis Date   GERD (gastroesophageal reflux disease)    Hypertension    Past Surgical History:  Procedure Laterality Date   LEEP  2015   UPPER GI ENDOSCOPY  09/2014   Patient Active Problem List   Diagnosis Date Noted   Allergic rhinitis 04/16/2017   Left wrist pain 04/16/2017   Right knee pain 01/14/2017   Restless leg syndrome 01/14/2017   Hemorrhoid 11/03/2015   Palpitations 03/15/2015   Benign essential HTN 01/26/2015   Benign paroxysmal positional vertigo 09/26/2014   GERD (gastroesophageal reflux disease) 09/26/2014   Anxiety state 09/26/2014    ONSET DATE: 6 months   REFERRING DIAG: R DeQuervain tenosynovitis  THERAPY DIAG:  Pain in right wrist  Stiffness of right hand, not elsewhere classified  Muscle weakness (generalized)  Radial styloid tenosynovitis  Rationale for Evaluation and Treatment: Rehabilitation  SUBJECTIVE:   SUBJECTIVE STATEMENT: I do not have the pain all the time.  Is for certain activities.  Picking but pocketbook can bring it down like this.  Bathroom use.  Carrying, pinching and twisting.  Writing.  And pick up brace I cannot wear it is too bulky. 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 A  corticosteroid injection in January provided temporary relief, but symptoms returned three weeks ago. The condition is not severe enough for surgery, and she prefers conservative management. Discomfort occurs during activities like opening jars and showering, with occasional burning sensations. Hand therapy is recommended to avoid surgery, offering relief through localized treatments and custom splint fabrication. Refer to hand therapy for these treatments and splint support, especially at night. Continue acetaminophen for pain management due to kidney-related contraindications for anti-inflammatory medications. Educate on avoiding topical corticosteroids as they are ineffective and may harm the skin. Diagnoses and all orders for this visit:  De Quervain's tenosynovitis - Ambulatory Referral to Occupational Therapy  This visit was coded based on medical decision making (MDM).  Dr Mozell Arias visit:   PRECAUTIONS: None     WEIGHT BEARING RESTRICTIONS: No  PAIN:  Are you having pain? 5/10 radial wrist tenderness and increase with pinching, gripping ,carrying and lifting   FALLS: Has patient fallen in last 6 months? No  LIVING ENVIRONMENT: Lives with: lives with their family and lives alone  PLOF: Independent-work for [employer] from home on computer.  Is mostly on her phone and watching TV.  Has a 67-year-old niece that she sees every week  PATIENT GOALS: I just want the pain better.  NEXT MD VISIT: 4 weeks after OT  OBJECTIVE:  Note: Objective measures were completed at Evaluation unless otherwise noted.  HAND DOMINANCE: Right  ADLs: Patient has increased pain and difficulty  with picking up objects.  Twisting or opening jars, writing, picking up a cup or bottle.  Opening packages, applying deodorant or wiping during bathroom use.  Pushing of socks or pants and pulling up.  Using her cell phone ,doing laundry  FUNCTIONAL OUTCOME MEASURES: Assess next session  UPPER EXTREMITY ROM:      Active ROM Right eval Left eval  Shoulder flexion    Shoulder abduction    Shoulder adduction    Shoulder extension    Shoulder internal rotation    Shoulder external rotation    Elbow flexion    Elbow extension    Wrist flexion 90 pain with lat pinch    Wrist extension 68   Wrist ulnar deviation 20 pain   Wrist radial deviation 18   Wrist pronation    Wrist supination    (Blank rows = not tested)  Active ROM Right eval Left eval  Thumb MCP (0-60)    Thumb IP (0-80)    Thumb Radial abd/add (0-55) 40    Thumb Palmar abd/add (0-45) 34    Thumb Opposition to Small Finger Pain with opposition to base of fifth    Index MCP (0-90)     Index PIP (0-100)     Index DIP (0-70)      Long MCP (0-90)      Long PIP (0-100)      Long DIP (0-70)      Ring MCP (0-90)      Ring PIP (0-100)      Ring DIP (0-70)      Little MCP (0-90)      Little PIP (0-100)      Little DIP (0-70)      (Blank rows = not tested)  ested)  HAND FUNCTION: Grip strength: Right: 44 lbs; Left: 46 lbs, Lateral pinch: Right: 10 lbs, Left: 10 lbs, and 3 point pinch: Right: 8 lbs, Left: 7 lbs discomfort and pain at base of thumb regular wrist  COORDINATION: Limited by stiffness in the thumb and pain  SENSATION: Denies any sensory issues  EDEMA: Radial wrist is a not  COGNITION: Overall cognitive status: Within functional limits for tasks assessed      TREATMENT DATE: 12/10/23                                                                                                                            Modalities: Contrast bath:  Time: 8 min Location: Right hand and wrist Decreased pain and inflammation and prior to review of home exercises for soft tissue and AAROM  Patient to do contrast to 3 times a day followed by soft tissue massage to webspace. Followed by active assisted range of motion for thumb palmar radial abduction pain-free 10 reps Patient to do several times during the day ice  massage over the first dorsal compartment  Patient unable to wear her thumb spica splint too bulky and large. Patient was fitted with a CMC  neoprene that appeared to have some relief of pain with her activities And patient can use it on the computer. Patient educated on use of it donning and doffing wearing.  Also reinforced with patient modification that needs to be done to avoid any lateral pinch or picking up objects with the thumb.  Patient to use her palms and forearms to carry and lift things As well as to enlarge objects or cylinder grips.       PATIENT EDUCATION: Education details: findings of eval and HEP  Person educated: Patient Education method: Explanation, Demonstration, Tactile cues, Verbal cues, and Handouts Education comprehension: verbalized understanding, returned demonstration, verbal cues required, and needs further education    GOALS: Goals reviewed with patient? Yes   LONG TERM GOALS: Target date: 8 wks   Patient to be independent in home program to verbalize 3-4 modifications to decrease pain Baseline: No knowledge of modifications or activities that causes the tendinitis Goal status: INITIAL  2.  Patient to be independent in home program using modalities, correct splint as well as home exercises to decrease pain and increased thumb palmar radial abduction Baseline: Patient with decreased thumb palmar radial abduction 34 to 40 degrees.  With 5/10 pain with thumb range of motion as well as wrist flexion and ulnar deviation Goal status: INITIAL  3.  Pain and tenderness decrease to less than 2/10 over first dorsal compartment for patient to show wrist and thumb active range of motion within normal limits performing ADLs and IADLs Baseline: Pain 5/10 with range of motion as well as tenderness of the first dorsal compartment that can increase.  With decreased thumb radial and palmar abduction with pain as well as ulnar deviation, radial deviation Goal status:  INITIAL  4.  Right grip strength increased with more than 5 pounds as well as lateral pinch be pain-free and symptom-free for patient to report decreased pain with bathing and dressing and toileting and eating and grooming. Baseline: Right hand 44 left hand 46 pounds, lateral pinch 10 pounds on left and right.  And 3-point pinch on the right 8 pounds left 7 pounds with thumb pain over the first dorsal compartment-patient report pain with bathing, dressing, toileting, ADLs and applying deodorant Goal status: INITIAL   ASSESSMENT:  CLINICAL IMPRESSION: Patient seen today for occupational therapy evaluation for right dominant hand radial tenosynovitis.  Patient had a shot in January but was symptom-free only for 3 weeks.  Patient with prefab thumb spica but reports unable to wear because of bulkiness and too large.  Patient's pain with active range of motion of thumb and wrist is 5/10 pain.  Patient with tenderness over first dorsal compartment as well as ulnar radial deviation.  Patient with decreased thumb palmar radial abduction as well as increased pain.  Patient limiting and functional use of right dominant hand in ADLs and IADLs.  Patient can benefit from skilled OT services to decrease pain increase motion increase strength to return to prior level of function.  Patient was fitted with a CMC neoprene to be able to work on the computer.  And wear as needed during the day.  Patient was provided with some modalities to do at home as well as active assisted range of motion for thumb palmar radial abduction.  Order assessed for custom splint next visit and if needed will initiate iontophoresis.  PERFORMANCE DEFICITS: in functional skills including ADLs, IADLs, ROM, strength, pain, flexibility, decreased knowledge of use of DME, and UE functional use,   and  psychosocial skills including environmental adaptation and routines and behaviors.   IMPAIRMENTS: are limiting patient from ADLs, IADLs, rest and  sleep, play, leisure, and social participation.   COMORBIDITIES: has no other co-morbidities that affects occupational performance. Patient will benefit from skilled OT to address above impairments and improve overall function.  MODIFICATION OR ASSISTANCE TO COMPLETE EVALUATION: No modification of tasks or assist necessary to complete an evaluation.  OT OCCUPATIONAL PROFILE AND HISTORY: Problem focused assessment: Including review of records relating to presenting problem.  CLINICAL DECISION MAKING: LOW - limited treatment options, no task modification necessary  REHAB POTENTIAL: Good for goals  EVALUATION COMPLEXITY: Low     PLAN:  OT FREQUENCY: 1-2x/week  OT DURATION: 6 weeks  PLANNED INTERVENTIONS: 97168 OT Re-evaluation, 97535 self care/ADL training, 16109 therapeutic exercise, 97530 therapeutic activity, 97140 manual therapy, 97035 ultrasound, 97018 paraffin, 60454 fluidotherapy, 97034 contrast bath, 97033 iontophoresis, 97760 Orthotic Initial, [id number] Orthotic/Prosthetic subsequent, manual lymph drainage, passive range of motion, coping strategies training, patient/family education, and DME and/or AE instructions    CONSULTED AND AGREED WITH PLAN OF CARE: Patient    Heloise Lobo, OTR/L,CLT 12/10/2023, 5:36 PM

## 2023-12-10 ENCOUNTER — Ambulatory Visit: Attending: Orthopedic Surgery | Admitting: Occupational Therapy

## 2023-12-10 ENCOUNTER — Encounter: Payer: Self-pay | Admitting: Occupational Therapy

## 2023-12-10 DIAGNOSIS — M25531 Pain in right wrist: Secondary | ICD-10-CM | POA: Insufficient documentation

## 2023-12-10 DIAGNOSIS — M25641 Stiffness of right hand, not elsewhere classified: Secondary | ICD-10-CM | POA: Diagnosis present

## 2023-12-10 DIAGNOSIS — M6281 Muscle weakness (generalized): Secondary | ICD-10-CM | POA: Insufficient documentation

## 2023-12-10 DIAGNOSIS — M654 Radial styloid tenosynovitis [de Quervain]: Secondary | ICD-10-CM | POA: Diagnosis present

## 2023-12-13 ENCOUNTER — Ambulatory Visit: Admitting: Occupational Therapy

## 2023-12-19 ENCOUNTER — Ambulatory Visit: Admitting: Occupational Therapy

## 2023-12-19 DIAGNOSIS — M25531 Pain in right wrist: Secondary | ICD-10-CM | POA: Diagnosis not present

## 2023-12-19 DIAGNOSIS — M654 Radial styloid tenosynovitis [de Quervain]: Secondary | ICD-10-CM

## 2023-12-19 DIAGNOSIS — M6281 Muscle weakness (generalized): Secondary | ICD-10-CM

## 2023-12-19 DIAGNOSIS — M25641 Stiffness of right hand, not elsewhere classified: Secondary | ICD-10-CM

## 2023-12-19 NOTE — Therapy (Signed)
 OUTPATIENT OCCUPATIONAL THERAPY ORTHO TREATMENT  Patient Name: Margaret May MRN: [id number] DOB:1976-06-26, 48 y.o., female Today's Date: 12/19/2023  PCP: Dr Tish Forge PROVIDER: Dr Mozell Arias  END OF SESSION:  OT End of Session - 12/19/23 1613     Visit Number 2    Number of Visits 10    Date for OT Re-Evaluation 02/04/24    OT Start Time 1615    Activity Tolerance Patient tolerated treatment well    Behavior During Therapy [facility] for tasks assessed/performed          Past Medical History:  Diagnosis Date   GERD (gastroesophageal reflux disease)    Hypertension    Past Surgical History:  Procedure Laterality Date   LEEP  2015   UPPER GI ENDOSCOPY  09/2014   Patient Active Problem List   Diagnosis Date Noted   Allergic rhinitis 04/16/2017   Left wrist pain 04/16/2017   Right knee pain 01/14/2017   Restless leg syndrome 01/14/2017   Hemorrhoid 11/03/2015   Palpitations 03/15/2015   Benign essential HTN 01/26/2015   Benign paroxysmal positional vertigo 09/26/2014   GERD (gastroesophageal reflux disease) 09/26/2014   Anxiety state 09/26/2014    ONSET DATE: 6 months   REFERRING DIAG: R DeQuervain tenosynovitis  THERAPY DIAG:  Pain in right wrist  Stiffness of right hand, not elsewhere classified  Muscle weakness (generalized)  Radial styloid tenosynovitis  Rationale for Evaluation and Treatment: Rehabilitation  SUBJECTIVE:   SUBJECTIVE STATEMENT: I do not have the pain all the time.  Is for certain activities.  Picking but pocketbook can bring it down like this.  Bathroom use.  Carrying, pinching and twisting.  Writing.  And pick up brace I cannot wear it is too bulky. 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 A corticosteroid injection in January provided temporary relief, but  symptoms returned three weeks ago. The condition is not severe enough for surgery, and she prefers conservative management. Discomfort occurs during activities like opening jars and showering, with occasional burning sensations. Hand therapy is recommended to avoid surgery, offering relief through localized treatments and custom splint fabrication. Refer to hand therapy for these treatments and splint support, especially at night. Continue acetaminophen for pain management due to kidney-related contraindications for anti-inflammatory medications. Educate on avoiding topical corticosteroids as they are ineffective and may harm the skin. Diagnoses and all orders for this visit:  De Quervain's tenosynovitis - Ambulatory Referral to Occupational Therapy  This visit was coded based on medical decision making (MDM).  Dr Mozell Arias visit:   PRECAUTIONS: None     WEIGHT BEARING RESTRICTIONS: No  PAIN:  Are you having pain? 5/10 radial wrist tenderness and increase with pinching, gripping ,carrying and lifting   FALLS: Has patient fallen in last 6 months? No  LIVING ENVIRONMENT: Lives with: lives with their family and lives alone  PLOF: Independent-work for [employer] from home on computer.  Is mostly on her phone and watching TV.  Has a 21-year-old niece that she sees every week  PATIENT GOALS: I just want the pain better.  NEXT MD VISIT: 4 weeks after OT  OBJECTIVE:  Note: Objective measures were completed at Evaluation unless otherwise noted.  HAND DOMINANCE: Right  ADLs: Patient has increased pain and difficulty with picking up objects.  Twisting or opening jars, writing, picking up a cup or bottle.  Opening packages,  applying deodorant or wiping during bathroom use.  Pushing of socks or pants and pulling up.  Using her cell phone ,doing laundry  FUNCTIONAL OUTCOME MEASURES: Assess next session  UPPER EXTREMITY ROM:     Active ROM Right eval Left eval R 12/19/23  Shoulder flexion      Shoulder abduction     Shoulder adduction     Shoulder extension     Shoulder internal rotation     Shoulder external rotation     Elbow flexion     Elbow extension     Wrist flexion 90 pain with lat pinch   90  Wrist extension 68  70  Wrist ulnar deviation 20 pain  30  Wrist radial deviation 18  15  Wrist pronation     Wrist supination     (Blank rows = not tested)  Active ROM Right eval Left eval R 12/19/23  Thumb MCP (0-60)     Thumb IP (0-80)     Thumb Radial abd/add (0-55) 40   40  Thumb Palmar abd/add (0-45) 34   50  Thumb Opposition to Small Finger Pain with opposition to base of fifth   Opposition to fifth pain-free slide down second fold pain  Index MCP (0-90)      Index PIP (0-100)      Index DIP (0-70)       Long MCP (0-90)       Long PIP (0-100)       Long DIP (0-70)       Ring MCP (0-90)       Ring PIP (0-100)       Ring DIP (0-70)       Little MCP (0-90)       Little PIP (0-100)       Little DIP (0-70)       (Blank rows = not tested)  ested)  HAND FUNCTION: Grip strength: Right: 44 lbs; Left: 46 lbs, Lateral pinch: Right: 10 lbs, Left: 10 lbs, and 3 point pinch: Right: 8 lbs, Left: 7 lbs discomfort and pain at base of thumb regular wrist  COORDINATION: Limited by stiffness in the thumb and pain  SENSATION: Denies any sensory issues  EDEMA: Radial wrist is a not  COGNITION: Overall cognitive status: Within functional limits for tasks assessed      TREATMENT DATE: 12/19/23        Patient arrived with increased thumb palmar abduction.  As well as opposition.  Radial abduction same with mostly tightness in the webspace. Wrist flexion extension as well as ulnar deviation within normal limits pain-free. Decreased radial deviation.  Reviewed with patient active assisted range of motion for wrist flexion extension as well as radial ulnar deviation added after contrast to home exercises                                                                                                                       Modalities: Contrast bath:  Time: 8 min  Location: Right hand and wrist Decreased pain and inflammation and prior to review of home exercises for soft tissue and AAROM  Patient to do contrast to 2 times a day followed by soft tissue massage to webspace. Soft tissue massage done to webspace followed by light claim for 2 x 30 seconds. Also metacarpal spreads and carpal spreads Followed by active assisted range of motion for thumb palmar /radial abduction pain-free 10 reps Reinforced patient to assist active assisted range of motion with radial abduction. Patient to do several times during the day ice massage over the first dorsal compartment  Patient unable to wear her thumb spica splint too bulky and large. Patient was fitted with a CMC neoprene at evaluation that appeared to have some relief of pain with her activities And patient can use it on the computer. Patient educated on use of it donning and doffing wearing.  Also reinforced with patient modification that needs to be done to avoid any lateral pinch or picking up objects with the thumb.  Patient to use her palms and forearms to carry and lift things As well as to enlarge objects or cylinder grips.   Iontophoresis with dexamethasone initiated. Medium patch on first dorsal compartment on the right 1.8 current 20 minutes Medication patch removed patient tiny blister-tolerated treatment well     PATIENT EDUCATION: Education details: findings of eval and HEP  Person educated: Patient Education method: Explanation, Demonstration, Tactile cues, Verbal cues, and Handouts Education comprehension: verbalized understanding, returned demonstration, verbal cues required, and needs further education    GOALS: Goals reviewed with patient? Yes   LONG TERM GOALS: Target date: 8 wks   Patient to be independent in home program to verbalize 3-4 modifications to decrease  pain Baseline: No knowledge of modifications or activities that causes the tendinitis Goal status: INITIAL  2.  Patient to be independent in home program using modalities, correct splint as well as home exercises to decrease pain and increased thumb palmar radial abduction Baseline: Patient with decreased thumb palmar radial abduction 34 to 40 degrees.  With 5/10 pain with thumb range of motion as well as wrist flexion and ulnar deviation Goal status: INITIAL  3.  Pain and tenderness decrease to less than 2/10 over first dorsal compartment for patient to show wrist and thumb active range of motion within normal limits performing ADLs and IADLs Baseline: Pain 5/10 with range of motion as well as tenderness of the first dorsal compartment that can increase.  With decreased thumb radial and palmar abduction with pain as well as ulnar deviation, radial deviation Goal status: INITIAL  4.  Right grip strength increased with more than 5 pounds as well as lateral pinch be pain-free and symptom-free for patient to report decreased pain with bathing and dressing and toileting and eating and grooming. Baseline: Right hand 44 left hand 46 pounds, lateral pinch 10 pounds on left and right.  And 3-point pinch on the right 8 pounds left 7 pounds with thumb pain over the first dorsal compartment-patient report pain with bathing, dressing, toileting, ADLs and applying deodorant Goal status: INITIAL   ASSESSMENT:  CLINICAL IMPRESSION: Patient seen for occupational therapy  for right dominant hand radial tenosynovitis.  Patient had a shot in January but was symptom-free only for 3 weeks.  Patient with prefab thumb spica but reports unable to wear because of bulkiness and too large.  Patient's pain with active range of motion of thumb and wrist is 5/10 pain.  Patient with tenderness over  first dorsal compartment as well as ulnar radial deviation.  Patient with decreased thumb palmar radial abduction as well as  increased pain.  Patient limiting and functional use of right dominant hand in ADLs and IADLs.  Patient arrives this date with increased palmar abduction pain-free but weak as well as wrist flexion extension and ulnar deviation pain-free.  Patient continues with tightness in thumb webspace and limited radial abduction.  Patient can benefit from skilled OT services to decrease pain increase motion increase strength to return to prior level of function.  Patient was fitted with a CMC neoprene to be able to work on the computer.  And wear as needed during the day.  Patient was provided with some modalities to do at home as well as active assisted range of motion for thumb palmar radial abduction.  Order assessed for custom splint next visit and if needed will initiate iontophoresis.  PERFORMANCE DEFICITS: in functional skills including ADLs, IADLs, ROM, strength, pain, flexibility, decreased knowledge of use of DME, and UE functional use,   and psychosocial skills including environmental adaptation and routines and behaviors.   IMPAIRMENTS: are limiting patient from ADLs, IADLs, rest and sleep, play, leisure, and social participation.   COMORBIDITIES: has no other co-morbidities that affects occupational performance. Patient will benefit from skilled OT to address above impairments and improve overall function.  MODIFICATION OR ASSISTANCE TO COMPLETE EVALUATION: No modification of tasks or assist necessary to complete an evaluation.  OT OCCUPATIONAL PROFILE AND HISTORY: Problem focused assessment: Including review of records relating to presenting problem.  CLINICAL DECISION MAKING: LOW - limited treatment options, no task modification necessary  REHAB POTENTIAL: Good for goals  EVALUATION COMPLEXITY: Low     PLAN:  OT FREQUENCY: 1-2x/week  OT DURATION: 6 weeks  PLANNED INTERVENTIONS: 97168 OT Re-evaluation, 97535 self care/ADL training, 18299 therapeutic exercise, 97530 therapeutic activity,  97140 manual therapy, 97035 ultrasound, 97018 paraffin, 37169 fluidotherapy, 97034 contrast bath, 97033 iontophoresis, 97760 Orthotic Initial, [id number] Orthotic/Prosthetic subsequent, manual lymph drainage, passive range of motion, coping strategies training, patient/family education, and DME and/or AE instructions    CONSULTED AND AGREED WITH PLAN OF CARE: Patient    Heloise Lobo, OTR/L,CLT 12/19/2023, 4:13 PM

## 2023-12-29 ENCOUNTER — Emergency Department
Admission: EM | Admit: 2023-12-29 | Discharge: 2023-12-29 | Disposition: A | Discharge status: Home / Self Care | Attending: Emergency Medicine | Admitting: Emergency Medicine

## 2023-12-29 ENCOUNTER — Other Ambulatory Visit: Payer: Self-pay

## 2023-12-29 ENCOUNTER — Emergency Department

## 2023-12-29 DIAGNOSIS — Z79899 Other long term (current) drug therapy: Secondary | ICD-10-CM | POA: Diagnosis not present

## 2023-12-29 DIAGNOSIS — E876 Hypokalemia: Secondary | ICD-10-CM | POA: Insufficient documentation

## 2023-12-29 DIAGNOSIS — M25512 Pain in left shoulder: Secondary | ICD-10-CM | POA: Insufficient documentation

## 2023-12-29 DIAGNOSIS — I10 Essential (primary) hypertension: Secondary | ICD-10-CM | POA: Diagnosis present

## 2023-12-29 LAB — CBC WITH DIFFERENTIAL/PLATELET
Abs Immature Granulocytes: 0.01 10*3/uL (ref 0.00–0.07)
Basophils Absolute: 0 10*3/uL (ref 0.0–0.1)
Basophils Relative: 1 %
Eosinophils Absolute: 0.1 10*3/uL (ref 0.0–0.5)
Eosinophils Relative: 1 %
HCT: 43.6 % (ref 36.0–46.0)
Hemoglobin: 14.1 g/dL (ref 12.0–15.0)
Immature Granulocytes: 0 %
Lymphocytes Relative: 46 %
Lymphs Abs: 2.8 10*3/uL (ref 0.7–4.0)
MCH: 25.5 pg — ABNORMAL LOW (ref 26.0–34.0)
MCHC: 32.3 g/dL (ref 30.0–36.0)
MCV: 78.8 fL — ABNORMAL LOW (ref 80.0–100.0)
Monocytes Absolute: 0.3 10*3/uL (ref 0.1–1.0)
Monocytes Relative: 6 %
Neutro Abs: 2.8 10*3/uL (ref 1.7–7.7)
Neutrophils Relative %: 46 %
Platelets: 312 10*3/uL (ref 150–400)
RBC: 5.53 MIL/uL — ABNORMAL HIGH (ref 3.87–5.11)
RDW: 13.7 % (ref 11.5–15.5)
WBC: 6 10*3/uL (ref 4.0–10.5)
nRBC: 0 % (ref 0.0–0.2)

## 2023-12-29 LAB — BASIC METABOLIC PANEL WITH GFR
Anion gap: 9 (ref 5–15)
BUN: 12 mg/dL (ref 6–20)
CO2: 24 mmol/L (ref 22–32)
Calcium: 9 mg/dL (ref 8.9–10.3)
Chloride: 104 mmol/L (ref 98–111)
Creatinine, Ser: 0.9 mg/dL (ref 0.44–1.00)
GFR, Estimated: >60 mL/min (ref >60)
Glucose, Bld: 98 mg/dL (ref 70–99)
Potassium: 2.8 mmol/L — ABNORMAL LOW (ref 3.5–5.1)
Sodium: 137 mmol/L (ref 135–145)

## 2023-12-29 LAB — D-DIMER, QUANTITATIVE: D-Dimer, Quant: 0.34 ug{FEU}/mL (ref 0.00–0.50)

## 2023-12-29 LAB — MAGNESIUM: Magnesium: 2 mg/dL (ref 1.7–2.4)

## 2023-12-29 LAB — TROPONIN I (HIGH SENSITIVITY)
Troponin I (High Sensitivity): 14 ng/L (ref <18)
Troponin I (High Sensitivity): 13 ng/L (ref <18)

## 2023-12-29 MED ORDER — HYDROCHLOROTHIAZIDE 12.5 MG PO TABS
12.5000 mg | ORAL_TABLET | Freq: Once | ORAL | Status: AC
  Administered 2023-12-29: 12.5 mg via ORAL
  Filled 2023-12-29: qty 1

## 2023-12-29 MED ORDER — POTASSIUM CHLORIDE CRYS ER 20 MEQ PO TBCR
40.0000 meq | EXTENDED_RELEASE_TABLET | Freq: Once | ORAL | Status: AC
  Administered 2023-12-29: 40 meq via ORAL
  Filled 2023-12-29: qty 2

## 2023-12-29 MED ORDER — LISINOPRIL 10 MG PO TABS: 10.0000 mg | ORAL_TABLET | Freq: Once | ORAL | Status: DC

## 2023-12-29 MED ORDER — ONDANSETRON 4 MG PO TBDP: 4.0000 mg | ORAL_TABLET | Freq: Four times a day (QID) | ORAL | 0 refills | Status: AC | PRN

## 2023-12-29 MED ORDER — POTASSIUM CHLORIDE CRYS ER 20 MEQ PO TBCR: 20.0000 meq | EXTENDED_RELEASE_TABLET | Freq: Two times a day (BID) | ORAL | 0 refills | Status: DC

## 2023-12-29 MED ORDER — ONDANSETRON 4 MG PO TBDP: 4.0000 mg | ORAL_TABLET | Freq: Four times a day (QID) | ORAL | 0 refills | Status: DC | PRN

## 2023-12-29 MED ORDER — AMLODIPINE BESYLATE 10 MG PO TABS: 10.0000 mg | ORAL_TABLET | Freq: Every day | ORAL | 1 refills | Status: DC

## 2023-12-29 MED ORDER — AMLODIPINE BESYLATE 5 MG PO TABS
10.0000 mg | ORAL_TABLET | Freq: Once | ORAL | Status: AC
  Administered 2023-12-29: 10 mg via ORAL
  Filled 2023-12-29: qty 2

## 2023-12-29 MED ORDER — IBUPROFEN 800 MG PO TABS: 800.0000 mg | ORAL_TABLET | Freq: Three times a day (TID) | ORAL | 0 refills | Status: DC | PRN

## 2023-12-29 MED ORDER — AMLODIPINE BESYLATE 10 MG PO TABS: 10.0000 mg | ORAL_TABLET | Freq: Every day | ORAL | 1 refills | Status: AC

## 2023-12-29 MED ORDER — IBUPROFEN 800 MG PO TABS: 800.0000 mg | ORAL_TABLET | Freq: Three times a day (TID) | ORAL | 0 refills | Status: AC | PRN

## 2023-12-29 NOTE — ED Provider Notes (Signed)
 [facility] Provider Note    Event Date/Time   First MD Initiated Contact with Patient 12/29/23 [id number]     (approximate)   History   Shoulder Pain   HPI  Margaret May is a 48 y.o. female with history of hypertension, GERD who presents to the emergency department complaints of achy pain in the left shoulder that radiated down into the forearm.  She denies any known injury.  States pain has already improved dramatically since arriving to the ED.  She denies that pain was worse with movement of her arm.  She denies any neck pain.  No chest pain or shortness of breath but did have some nausea.  No associated dizziness, diaphoresis.  Has never had similar symptoms.  She is hypertensive here reports that she has not taken her blood pressure medications because she misplaced them.  No history of PE, DVT, exogenous estrogen use, recent fractures, surgery, trauma, hospitalization, prolonged travel or other immobilization. No lower extremity swelling or pain. No calf tenderness.    History provided by patient.    Past Medical History:  Diagnosis Date   GERD (gastroesophageal reflux disease)    Hypertension     Past Surgical History:  Procedure Laterality Date   LEEP  2015   UPPER GI ENDOSCOPY  09/2014    MEDICATIONS:  Prior to Admission medications   Medication Sig Start Date End Date Taking? Authorizing Provider  amLODipine  (NORVASC ) 10 MG tablet Take 10 mg by mouth daily.    [provider]  fluticasone (FLONASE) 50 MCG/ACT nasal spray Place 1 spray into both nostrils every other day.    [provider]  ibuprofen (ADVIL,MOTRIN) 800 MG tablet Take 800 mg by mouth every 8 (eight) hours as needed.    [provider]  lisinopril-hydrochlorothiazide  (PRINZIDE,ZESTORETIC) 10-12.5 MG tablet Take 1 tablet by mouth daily. 02/06/18   [provider]  meloxicam  (MOBIC ) 15 MG tablet Take 1 tablet (15 mg total) by mouth daily.  11/22/19   Cuthriell, Dorn BIRCH, PA-C  methocarbamol  (ROBAXIN ) 500 MG tablet Take 1 tablet (500 mg total) by mouth 4 (four) times daily. 11/22/19   Cuthriell, Jonathan D, PA-C  ranitidine  (ZANTAC ) 150 MG tablet Take 1 tablet by mouth daily as needed.    [provider]    Physical Exam   Triage Vital Signs: ED Triage Vitals  Encounter Vitals Group     BP 12/29/23 0537 (!) 183/124     Girls Systolic BP Percentile --      Girls Diastolic BP Percentile --      Boys Systolic BP Percentile --      Boys Diastolic BP Percentile --      Pulse Rate 12/29/23 0537 99     Resp 12/29/23 0537 16     Temp 12/29/23 0537 98.7 F (37.1 C)     Temp Source 12/29/23 0537 Oral     SpO2 12/29/23 0537 100 %     Weight 12/29/23 0532 146 lb (66.2 kg)     Height 12/29/23 0532 5' 2 (1.575 m)     Head Circumference --      Peak Flow --      Pain Score 12/29/23 0532 5     Pain Loc --      Pain Education --      Exclude from Growth Chart --     Most recent vital signs: Vitals:   12/29/23 0537 12/29/23 0543  BP: (!) 183/124 ROLLEN)  188/138  Pulse: 99 89  Resp: 16 11  Temp: 98.7 F (37.1 C)   SpO2: 100% 100%    CONSTITUTIONAL: Alert, responds appropriately to questions. Well-appearing; well-nourished HEAD: Normocephalic, atraumatic EYES: Conjunctivae clear, pupils appear equal, sclera nonicteric ENT: normal nose; moist mucous membranes NECK: Supple, normal ROM, no midline spinal tenderness or step-off or deformity CARD: RRR; S1 and S2 appreciated, chest wall is nontender to palpation RESP: Normal chest excursion without splinting or tachypnea; breath sounds clear and equal bilaterally; no wheezes, no rhonchi, no rales, no hypoxia or respiratory distress, speaking full sentences ABD/GI: Non-distended; soft, non-tender, no rebound, no guarding, no peritoneal signs BACK: The back appears normal EXT: Normal ROM in all joints; no deformity noted, no edema, full range of motion in all joints of the  left arm, patient is able to passively range the left shoulder without any difficulty.  There is no tenderness to the left shoulder, neck, trapezius muscle, upper arm or forearm.  Compartments of the left arm are soft.  2+ left radial pulse.  Normal capillary refill.  Normal sensation.  Normal grip strength.  No calf tenderness or calf swelling. SKIN: Normal color for age and race; warm; no rash on exposed skin NEURO: Moves all extremities equally, normal speech PSYCH: The patient's mood and manner are appropriate.   ED Results / Procedures / Treatments   LABS: (all labs ordered are listed, but only abnormal results are displayed) Labs Reviewed  CBC WITH DIFFERENTIAL/PLATELET - Abnormal; Notable for the following components:      Result Value   RBC 5.53 (*)    MCV 78.8 (*)    MCH 25.5 (*)    All other components within normal limits  BASIC METABOLIC PANEL WITH GFR - Abnormal; Notable for the following components:   Potassium 2.8 (*)    All other components within normal limits  MAGNESIUM  TROPONIN I (HIGH SENSITIVITY)     EKG:    Date: 12/29/2023 5:44 AM  Rate: 89  Rhythm: normal sinus rhythm  QRS Axis: normal  Intervals: normal  ST/T Wave abnormalities: normal  Conduction Disutrbances: none  Narrative Interpretation: unremarkable    RADIOLOGY: My personal review and interpretation of imaging: Left shoulder x-ray, chest x-ray unremarkable.  I have personally reviewed all radiology reports.   DG Shoulder Left Portable Result Date: 12/29/2023 CLINICAL DATA:  Left shoulder pain. EXAM: LEFT SHOULDER COMPARISON:  None Available. FINDINGS: There is no evidence of fracture or dislocation. There is no evidence of arthropathy or other focal bone abnormality. Soft tissues are unremarkable. IMPRESSION: Negative. Electronically Signed   By: Waddell Calk M.D.   On: 12/29/2023 06:38   DG Chest Portable 1 View Result Date: 12/29/2023 CLINICAL DATA:  Pain.  Nausea.  Trauma to the  shoulder. EXAM: PORTABLE CHEST 1 VIEW COMPARISON:  11/22/2019 FINDINGS: Heart size and mediastinal contours are stable. No pleural fluid, interstitial edema or airspace disease. The visualized osseous structures are grossly intact. IMPRESSION: No active disease. Electronically Signed   By: Waddell Calk M.D.   On: 12/29/2023 06:37     PROCEDURES:  Critical Care performed: No      Procedures    IMPRESSION / MDM / ASSESSMENT AND PLAN / ED COURSE  I reviewed the triage vital signs and the nursing notes.    Patient here with left shoulder pain, hypertension.  The patient is on the cardiac monitor to evaluate for evidence of arrhythmia and/or significant heart rate changes.   DIFFERENTIAL DIAGNOSIS (includes  but not limited to):   Rotator cuff tendinitis, bursitis, muscle strain, muscle spasm, ACS, hypertensive urgency, hypertensive emergency, doubt PE or dissection   Patient's presentation is most consistent with acute presentation with potential threat to life or bodily function.   PLAN: EKG nonischemic.  Will obtain cardiac labs, chest x-ray, left shoulder x-ray.  Will give amlodipine  10 mg as this is what she takes at home.  Will refill this medication as well.  Patient declines anything for pain.   MEDICATIONS GIVEN IN ED: Medications  potassium chloride SA (KLOR-CON M) CR tablet 40 mEq (has no administration in time range)  amLODipine  (NORVASC ) tablet 10 mg (10 mg Oral Given 12/29/23 0637)  hydrochlorothiazide  (HYDRODIURIL ) tablet 12.5 mg (12.5 mg Oral Given 12/29/23 9362)     ED COURSE: Labs show normal hemoglobin.  Potassium of 2.8.  It is unclear if patient is still on HCTZ.  Will give oral replacement.  Will check magnesium level.  First troponin negative.  Chest x-ray, left shoulder x-ray reviewed and interpreted by myself and the radiologist and are unremarkable.  Signed out to oncoming ED physician to follow-up on second troponin, magnesium level and recheck blood  pressure.   CONSULTS: Pending further workup.   OUTSIDE RECORDS REVIEWED: Reviewed last PCP note in May 2025.       FINAL CLINICAL IMPRESSION(S) / ED DIAGNOSES   Final diagnoses:  Acute pain of left shoulder  Uncontrolled hypertension  Hypokalemia     Rx / DC Orders   ED Discharge Orders          Ordered    Ambulatory Referral to Primary Care (Establish Care)  Status:  Canceled        12/29/23 0618    ibuprofen (ADVIL) 800 MG tablet  Every 8 hours PRN        12/29/23 0704    potassium chloride SA (KLOR-CON M) 20 MEQ tablet  2 times daily        12/29/23 0704    ondansetron (ZOFRAN-ODT) 4 MG disintegrating tablet  Every 6 hours PRN        12/29/23 0704    amLODipine  (NORVASC ) 10 MG tablet  Daily        12/29/23 0704             Note:  This document was prepared using Dragon voice recognition software and may include unintentional dictation errors.   Tarez Bowns, Josette SAILOR, DO 12/29/23 [phone]

## 2023-12-29 NOTE — ED Triage Notes (Addendum)
 Pt presented to ED from home with c/o left shoulder pain starting at 0300. Pt also endorses nausea. Denies recent trauma to shoulder. Hx HTN. Denies chest pain, denies shortness of breath.

## 2023-12-29 NOTE — ED Provider Notes (Signed)
-----------------------------------------   7:02 AM on 12/29/2023 -----------------------------------------  Blood pressure (!) 188/138, pulse 89, temperature 98.7 F (37.1 C), temperature source Oral, resp. rate 11, height 5' 2 (1.575 m), weight 66.2 kg, last menstrual period 04/24/2017, SpO2 100%.  Assuming care from Dr. Neomi.  In short, Margaret May is a 48 y.o. female with a chief complaint of Shoulder Pain .  Refer to the original H&P for additional details.  The current plan of care is to follow-up two sets of troponin to check for cardiac cause of shoulder pain.  ----------------------------------------- 10:03 AM on 12/29/2023 ----------------------------------------- 2 sets of troponin within normal limits and I doubt ACS.  On reassessment, she did complain of left-sided pleuritic chest discomfort, D-dimer within normal limits and I doubt PE as patient is low risk by Wells criteria.  Given reassuring workup, she is appropriate for discharge home with outpatient follow-up with her PCP for recheck of blood pressure.  She was counseled to return to the ED for new or worsening symptoms, patient agrees with plan.       Willo Dunnings, MD 12/29/23 1003

## 2023-12-29 NOTE — ED Notes (Signed)
 CCMD called for cardiac monitoring.

## 2023-12-29 NOTE — Discharge Instructions (Signed)
 You may alternate over the counter Tylenol 1000 mg every 6 hours as needed for pain, fever and Ibuprofen 800 mg every 6-8 hours as needed for pain, fever.  Please take Ibuprofen with food.  Do not take more than 4000 mg of Tylenol (acetaminophen) in a 24 hour period.

## 2023-12-30 ENCOUNTER — Ambulatory Visit: Admitting: Occupational Therapy

## 2023-12-30 DIAGNOSIS — M25641 Stiffness of right hand, not elsewhere classified: Secondary | ICD-10-CM

## 2023-12-30 DIAGNOSIS — M25531 Pain in right wrist: Secondary | ICD-10-CM

## 2023-12-30 DIAGNOSIS — M6281 Muscle weakness (generalized): Secondary | ICD-10-CM

## 2023-12-30 DIAGNOSIS — M654 Radial styloid tenosynovitis [de Quervain]: Secondary | ICD-10-CM

## 2023-12-30 NOTE — Therapy (Signed)
 OUTPATIENT OCCUPATIONAL THERAPY ORTHO TREATMENT  Patient Name: Margaret May MRN: [id number] DOB:Nov 04, 1975, 48 y.o., female Today's Date: 12/30/2023  PCP: Dr Verdon MART PROVIDER: Dr Kathlynn  END OF SESSION:  OT End of Session - 12/30/23 1605     Visit Number 3    Number of Visits 10    Date for OT Re-Evaluation 02/04/24    OT Start Time 1605    OT Stop Time 1702    OT Time Calculation (min) 57 min    Activity Tolerance Patient tolerated treatment well    Behavior During Therapy [facility] for tasks assessed/performed          Past Medical History:  Diagnosis Date   GERD (gastroesophageal reflux disease)    Hypertension    Past Surgical History:  Procedure Laterality Date   LEEP  2015   UPPER GI ENDOSCOPY  09/2014   Patient Active Problem List   Diagnosis Date Noted   Allergic rhinitis 04/16/2017   Left wrist pain 04/16/2017   Right knee pain 01/14/2017   Restless leg syndrome 01/14/2017   Hemorrhoid 11/03/2015   Palpitations 03/15/2015   Benign essential HTN 01/26/2015   Benign paroxysmal positional vertigo 09/26/2014   GERD (gastroesophageal reflux disease) 09/26/2014   Anxiety state 09/26/2014    ONSET DATE: 6 months   REFERRING DIAG: R DeQuervain tenosynovitis  THERAPY DIAG:  Pain in right wrist  Stiffness of right hand, not elsewhere classified  Muscle weakness (generalized)  Radial styloid tenosynovitis  Rationale for Evaluation and Treatment: Rehabilitation  SUBJECTIVE:   SUBJECTIVE STATEMENT: I tried Friday to pull a box open with my thumb.  And it hurt.  So I put the hot and cold on and I wore my splint -so that I am good when I see you today. 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 A corticosteroid injection in January provided temporary relief, but symptoms  returned three weeks ago. The condition is not severe enough for surgery, and she prefers conservative management. Discomfort occurs during activities like opening jars and showering, with occasional burning sensations. Hand therapy is recommended to avoid surgery, offering relief through localized treatments and custom splint fabrication. Refer to hand therapy for these treatments and splint support, especially at night. Continue acetaminophen for pain management due to kidney-related contraindications for anti-inflammatory medications. Educate on avoiding topical corticosteroids as they are ineffective and may harm the skin. Diagnoses and all orders for this visit:  De Quervain's tenosynovitis - Ambulatory Referral to Occupational Therapy  This visit was coded based on medical decision making (MDM).  Dr Kathlynn visit:   PRECAUTIONS: None     WEIGHT BEARING RESTRICTIONS: No  PAIN:  Are you having pain? 4-5/10 radial wrist tenderness and increase with pinching, gripping ,carrying and lifting   FALLS: Has patient fallen in last 6 months? No  LIVING ENVIRONMENT: Lives with: lives with their family and lives alone  PLOF: Independent-work for [employer] from home on computer.  Is mostly on her phone and watching TV.  Has a 66-year-old niece that she sees every week  PATIENT GOALS: I just want the pain better.  NEXT MD VISIT: 4 weeks after OT  OBJECTIVE:  Note: Objective measures were completed at Evaluation unless otherwise noted.  HAND DOMINANCE: Right  ADLs: Patient has increased pain and difficulty with picking up objects.  Twisting or opening jars, writing,  picking up a cup or bottle.  Opening packages, applying deodorant or wiping during bathroom use.  Pushing of socks or pants and pulling up.  Using her cell phone ,doing laundry  FUNCTIONAL OUTCOME MEASURES: Assess next session  UPPER EXTREMITY ROM:     Active ROM Right eval Left eval R 12/19/23  Shoulder flexion     Shoulder  abduction     Shoulder adduction     Shoulder extension     Shoulder internal rotation     Shoulder external rotation     Elbow flexion     Elbow extension     Wrist flexion 90 pain with lat pinch   90  Wrist extension 68  70  Wrist ulnar deviation 20 pain  30  Wrist radial deviation 18  15  Wrist pronation     Wrist supination     (Blank rows = not tested)  Active ROM Right eval Left eval R 12/19/23  Thumb MCP (0-60)     Thumb IP (0-80)     Thumb Radial abd/add (0-55) 40   40  Thumb Palmar abd/add (0-45) 34   50  Thumb Opposition to Small Finger Pain with opposition to base of fifth   Opposition to fifth pain-free slide down second fold pain  Index MCP (0-90)      Index PIP (0-100)      Index DIP (0-70)       Long MCP (0-90)       Long PIP (0-100)       Long DIP (0-70)       Ring MCP (0-90)       Ring PIP (0-100)       Ring DIP (0-70)       Little MCP (0-90)       Little PIP (0-100)       Little DIP (0-70)       (Blank rows = not tested)  ested)  HAND FUNCTION: Grip strength: Right: 44 lbs; Left: 46 lbs, Lateral pinch: Right: 10 lbs, Left: 10 lbs, and 3 point pinch: Right: 8 lbs, Left: 7 lbs discomfort and pain at base of thumb regular wrist  COORDINATION: Limited by stiffness in the thumb and pain  SENSATION: Denies any sensory issues  EDEMA: Radial wrist is a not  COGNITION: Overall cognitive status: Within functional limits for tasks assessed      TREATMENT DATE: 12/30/23        Cont to show increase thumb palmar abduction.  As well as opposition.  Radial abduction same - tightness in the webspace. Wrist flexion extension as well as ulnar deviation within normal limits pain-free. Decreased radial deviation.  Reviewed with patient active assisted range of motion for wrist flexion extension as well as radial ulnar deviation after contrast to home exercises  Modalities: Contrast bath:  Time: 8 min Location: Right hand and wrist Decreased pain and inflammation and prior to review of home exercises for soft tissue and AAROM  Patient to do contrast to 2 times a day followed by soft tissue massage to webspace. Soft tissue massage done to webspace  Also metacarpal spreads and carpal spreads Followed by active assisted range of motion for thumb palmar /radial abduction pain-free 10 reps Reinforced patient to assist active assisted range of motion with radial abduction. Several times during day- pt work on computer and hold mouse Patient to do several times during the day ice massage over the first dorsal compartment  Patient unable to wear her thumb spica splint too bulky and large. Patient was fitted with a CMC neoprene at evaluation that appeared to have some relief of pain with her activities And patient can use it on the computer. Patient educated on use of it donning and doffing wearing.  Also reinforced with patient modification that needs to be done to avoid any lateral pinch or picking up objects with the thumb.  Patient to use her palms and forearms to carry and lift things As well as to enlarge objects or cylinder grips.   Iontophoresis with dexamethazone  Medium patch on first dorsal compartment on the right 1.8 current 20 minutes Tolerate well - pt to keep on for hour today afterwards      PATIENT EDUCATION: Education details: findings of eval and HEP  Person educated: Patient Education method: Explanation, Demonstration, Tactile cues, Verbal cues, and Handouts Education comprehension: verbalized understanding, returned demonstration, verbal cues required, and needs further education    GOALS: Goals reviewed with patient? Yes   LONG TERM GOALS: Target date: 8 wks   Patient to be independent in home program to verbalize 3-4 modifications to decrease pain Baseline: No knowledge of modifications or  activities that causes the tendinitis Goal status: INITIAL  2.  Patient to be independent in home program using modalities, correct splint as well as home exercises to decrease pain and increased thumb palmar radial abduction Baseline: Patient with decreased thumb palmar radial abduction 34 to 40 degrees.  With 5/10 pain with thumb range of motion as well as wrist flexion and ulnar deviation Goal status: INITIAL  3.  Pain and tenderness decrease to less than 2/10 over first dorsal compartment for patient to show wrist and thumb active range of motion within normal limits performing ADLs and IADLs Baseline: Pain 5/10 with range of motion as well as tenderness of the first dorsal compartment that can increase.  With decreased thumb radial and palmar abduction with pain as well as ulnar deviation, radial deviation Goal status: INITIAL  4.  Right grip strength increased with more than 5 pounds as well as lateral pinch be pain-free and symptom-free for patient to report decreased pain with bathing and dressing and toileting and eating and grooming. Baseline: Right hand 44 left hand 46 pounds, lateral pinch 10 pounds on left and right.  And 3-point pinch on the right 8 pounds left 7 pounds with thumb pain over the first dorsal compartment-patient report pain with bathing, dressing, toileting, ADLs and applying deodorant Goal status: INITIAL   ASSESSMENT:  CLINICAL IMPRESSION: Patient seen for occupational therapy  for right dominant hand radial tenosynovitis.  Patient had a shot in January but was symptom-free only for 3 weeks.  Patient with prefab thumb spica but reports unable to wear because of bulkiness and too large.  Patient's pain with active range  of motion of thumb and wrist is 5/10 pain.  Patient with tenderness over first dorsal compartment as well as ulnar radial deviation.  Patient with decreased thumb palmar radial abduction as well as increased pain.  Patient limiting and functional use  of right dominant hand in ADLs and IADLs.  Patient arrives this date with increased palmar abduction pain-free but weak as well as wrist flexion extension and ulnar deviation pain-free.  Patient continues with tightness in thumb webspace and limited radial abduction.  Patient can benefit from skilled OT services to decrease pain increase motion increase strength to return to prior level of function.  Patient was fitted with a CMC neoprene to be able to work on the computer.  And wear as needed during the day.  Patient was provided with some modalities to do at home as well as active assisted range of motion for thumb palmar radial abduction.  Order assessed for custom splint next visit and if needed will initiate iontophoresis.  PERFORMANCE DEFICITS: in functional skills including ADLs, IADLs, ROM, strength, pain, flexibility, decreased knowledge of use of DME, and UE functional use,   and psychosocial skills including environmental adaptation and routines and behaviors.   IMPAIRMENTS: are limiting patient from ADLs, IADLs, rest and sleep, play, leisure, and social participation.   COMORBIDITIES: has no other co-morbidities that affects occupational performance. Patient will benefit from skilled OT to address above impairments and improve overall function.  MODIFICATION OR ASSISTANCE TO COMPLETE EVALUATION: No modification of tasks or assist necessary to complete an evaluation.  OT OCCUPATIONAL PROFILE AND HISTORY: Problem focused assessment: Including review of records relating to presenting problem.  CLINICAL DECISION MAKING: LOW - limited treatment options, no task modification necessary  REHAB POTENTIAL: Good for goals  EVALUATION COMPLEXITY: Low     PLAN:  OT FREQUENCY: 1-2x/week  OT DURATION: 6 weeks  PLANNED INTERVENTIONS: 97168 OT Re-evaluation, 97535 self care/ADL training, 02889 therapeutic exercise, 97530 therapeutic activity, 97140 manual therapy, 97035 ultrasound, 97018  paraffin, 02960 fluidotherapy, 97034 contrast bath, 97033 iontophoresis, 97760 Orthotic Initial, [id number] Orthotic/Prosthetic subsequent, manual lymph drainage, passive range of motion, coping strategies training, patient/family education, and DME and/or AE instructions    CONSULTED AND AGREED WITH PLAN OF CARE: Patient    Ancel Peters, OTR/L,CLT 12/30/2023, 4:59 PM

## 2024-01-06 ENCOUNTER — Ambulatory Visit: Admitting: Occupational Therapy

## 2024-01-06 DIAGNOSIS — M25531 Pain in right wrist: Secondary | ICD-10-CM

## 2024-01-06 DIAGNOSIS — M6281 Muscle weakness (generalized): Secondary | ICD-10-CM

## 2024-01-06 DIAGNOSIS — M654 Radial styloid tenosynovitis [de Quervain]: Secondary | ICD-10-CM

## 2024-01-06 DIAGNOSIS — M25641 Stiffness of right hand, not elsewhere classified: Secondary | ICD-10-CM

## 2024-01-06 NOTE — Therapy (Signed)
 OUTPATIENT OCCUPATIONAL THERAPY ORTHO TREATMENT  Patient Name: Margaret May MRN: [id number] DOB:[birth year], 48 y.o., female Today's Date: 01/06/2024  PCP: Dr Verdon MART PROVIDER: Dr Kathlynn  END OF SESSION:  OT End of Session - 01/06/24 1520     Visit Number 4    Number of Visits 10    Date for OT Re-Evaluation 02/04/24    OT Start Time 1520    OT Stop Time 1605    OT Time Calculation (min) 45 min    Activity Tolerance Patient tolerated treatment well    Behavior During Therapy [facility] for tasks assessed/performed          Past Medical History:  Diagnosis Date   GERD (gastroesophageal reflux disease)    Hypertension    Past Surgical History:  Procedure Laterality Date   LEEP  2015   UPPER GI ENDOSCOPY  09/2014   Patient Active Problem List   Diagnosis Date Noted   Allergic rhinitis 04/16/2017   Left wrist pain 04/16/2017   Right knee pain 01/14/2017   Restless leg syndrome 01/14/2017   Hemorrhoid 11/03/2015   Palpitations 03/15/2015   Benign essential HTN 01/26/2015   Benign paroxysmal positional vertigo 09/26/2014   GERD (gastroesophageal reflux disease) 09/26/2014   Anxiety state 09/26/2014    ONSET DATE: 6 months   REFERRING DIAG: R DeQuervain tenosynovitis  THERAPY DIAG:  Pain in right wrist  Stiffness of right hand, not elsewhere classified  Muscle weakness (generalized)  Radial styloid tenosynovitis  Rationale for Evaluation and Treatment: Rehabilitation  SUBJECTIVE:   SUBJECTIVE STATEMENT: The pain is better.  I was able to do few things without pain.  But then randomly it hurts again.  My thumb is still weak. 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 A corticosteroid injection in January provided temporary relief, but symptoms returned three weeks ago. The  condition is not severe enough for surgery, and she prefers conservative management. Discomfort occurs during activities like opening jars and showering, with occasional burning sensations. Hand therapy is recommended to avoid surgery, offering relief through localized treatments and custom splint fabrication. Refer to hand therapy for these treatments and splint support, especially at night. Continue acetaminophen for pain management due to kidney-related contraindications for anti-inflammatory medications. Educate on avoiding topical corticosteroids as they are ineffective and may harm the skin. Diagnoses and all orders for this visit:  De Quervain's tenosynovitis - Ambulatory Referral to Occupational Therapy  This visit was coded based on medical decision making (MDM).  Dr Kathlynn visit:   PRECAUTIONS: None     WEIGHT BEARING RESTRICTIONS: No  PAIN:  Are you having pain? 1-2/10 radial wrist tenderness and increase with pinching, gripping ,carrying and lifting   FALLS: Has patient fallen in last 6 months? No  LIVING ENVIRONMENT: Lives with: lives with their family and lives alone  PLOF: Independent-work for [employer] from home on computer.  Is mostly on her phone and watching TV.  Has a 51-year-old niece that she sees every week  PATIENT GOALS: I just want the pain better.  NEXT MD VISIT: 4 weeks after OT  OBJECTIVE:  Note: Objective measures were completed at Evaluation unless otherwise noted.  HAND DOMINANCE: Right  ADLs: Patient has increased pain and difficulty with picking up objects.  Twisting or opening jars, writing, picking up a cup or bottle.  Opening packages, applying deodorant or  wiping during bathroom use.  Pushing of socks or pants and pulling up.  Using her cell phone ,doing laundry  FUNCTIONAL OUTCOME MEASURES: Assess next session  UPPER EXTREMITY ROM:     Active ROM Right eval Left eval R 12/19/23  Shoulder flexion     Shoulder abduction     Shoulder  adduction     Shoulder extension     Shoulder internal rotation     Shoulder external rotation     Elbow flexion     Elbow extension     Wrist flexion 90 pain with lat pinch   90  Wrist extension 68  70  Wrist ulnar deviation 20 pain  30  Wrist radial deviation 18  15  Wrist pronation     Wrist supination     (Blank rows = not tested)  Active ROM Right eval Left eval R 12/19/23  Thumb MCP (0-60)     Thumb IP (0-80)     Thumb Radial abd/add (0-55) 40   40  Thumb Palmar abd/add (0-45) 34   50  Thumb Opposition to Small Finger Pain with opposition to base of fifth   Opposition to fifth pain-free slide down second fold pain  Index MCP (0-90)      Index PIP (0-100)      Index DIP (0-70)       Long MCP (0-90)       Long PIP (0-100)       Long DIP (0-70)       Ring MCP (0-90)       Ring PIP (0-100)       Ring DIP (0-70)       Little MCP (0-90)       Little PIP (0-100)       Little DIP (0-70)       (Blank rows = not tested)  ested)  HAND FUNCTION: Grip strength: Right: 44 lbs; Left: 46 lbs, Lateral pinch: Right: 10 lbs, Left: 10 lbs, and 3 point pinch: Right: 8 lbs, Left: 7 lbs discomfort and pain at base of thumb regular wrist  COORDINATION: Limited by stiffness in the thumb and pain  SENSATION: Denies any sensory issues  EDEMA: Radial wrist is a not  COGNITION: Overall cognitive status: Within functional limits for tasks assessed      TREATMENT DATE: 01/06/24        Cont to show increase thumb palmar abduction.  As well as opposition.  Radial abduction same -less tightness in the webspace. Continues to have decreased radial abduction unable to hold keep placed on hold Wrist flexion extension as well as ulnar deviation within normal limits pain-free.                                                                                                                      Modalities: Contrast bath:  Time: 8 min Location: Right hand and wrist Decreased pain and  inflammation and prior to review of home exercises for soft tissue  and AAROM  Patient to do contrast to 2-3 times a day followed by soft tissue massage to webspace. Soft tissue massage done to webspace  Also metacarpal spreads and carpal spreads Followed by active assisted range of motion for thumb palmar  pain-free 10 reps Several times during the day patient to do stabilize thumb with ulnar deviation of the wrist away from the thumb.  10 reps hold 5 seconds As well as asymmetric strengthening with thumb at side of palm pain-free 10 hold 3 seconds Reviewed with patient several times.  Several times during day- pt work on computer and hold mouse Patient to do several times during the day ice massage over the first dorsal compartment  Patient unable to wear her thumb spica splint too bulky and large. Patient was fitted with a CMC neoprene at evaluation that appeared to have some relief of pain with her activities And patient can use it on the computer. Patient educated on use of it donning and doffing wearing.  Also reinforced with patient modification that needs to be done to avoid any lateral pinch or picking up objects with the thumb.  Patient to use her palms and forearms to carry and lift things As well as to enlarge objects or cylinder grips.   Iontophoresis with dexamethazone  Medium patch on first dorsal compartment on the right 2.0 current 19 minutes Tolerate well - pt to keep on for hour today afterwards      PATIENT EDUCATION: Education details: findings of eval and HEP  Person educated: Patient Education method: Explanation, Demonstration, Tactile cues, Verbal cues, and Handouts Education comprehension: verbalized understanding, returned demonstration, verbal cues required, and needs further education    GOALS: Goals reviewed with patient? Yes   LONG TERM GOALS: Target date: 8 wks   Patient to be independent in home program to verbalize 3-4 modifications to  decrease pain Baseline: No knowledge of modifications or activities that causes the tendinitis Goal status: INITIAL  2.  Patient to be independent in home program using modalities, correct splint as well as home exercises to decrease pain and increased thumb palmar radial abduction Baseline: Patient with decreased thumb palmar radial abduction 34 to 40 degrees.  With 5/10 pain with thumb range of motion as well as wrist flexion and ulnar deviation Goal status: INITIAL  3.  Pain and tenderness decrease to less than 2/10 over first dorsal compartment for patient to show wrist and thumb active range of motion within normal limits performing ADLs and IADLs Baseline: Pain 5/10 with range of motion as well as tenderness of the first dorsal compartment that can increase.  With decreased thumb radial and palmar abduction with pain as well as ulnar deviation, radial deviation Goal status: INITIAL  4.  Right grip strength increased with more than 5 pounds as well as lateral pinch be pain-free and symptom-free for patient to report decreased pain with bathing and dressing and toileting and eating and grooming. Baseline: Right hand 44 left hand 46 pounds, lateral pinch 10 pounds on left and right.  And 3-point pinch on the right 8 pounds left 7 pounds with thumb pain over the first dorsal compartment-patient report pain with bathing, dressing, toileting, ADLs and applying deodorant Goal status: INITIAL   ASSESSMENT:  CLINICAL IMPRESSION: Patient seen for occupational therapy  for right dominant hand radial tenosynovitis.  Patient had a shot in January but was symptom-free only for 3 weeks.  Patient with prefab thumb spica but reports unable to wear because of bulkiness  and too large.  Patient's pain with active range of motion of thumb and wrist is 5/10 pain.  Patient with tenderness over first dorsal compartment as well as ulnar radial deviation.  Patient with decreased thumb palmar radial abduction as well  as increased pain.  Patient limiting and functional use of right dominant hand in ADLs and IADLs.  Patient arrives this date with decreased pain and tenderness over the first dorsal compartment as well as Terrilee.  Patient continues to have decreased radial abduction of the thumb.   Patient continues with tightness in thumb webspace and limited radial abduction.  Upgrade home exercises for patient to do stabilize thumb with the ulnar deviation of the wrist away from thumb as well as asymmetric strengthening to the thumb at the wrist against the palm.  Pain-free.  Patient can benefit from skilled OT services to decrease pain increase motion increase strength to return to prior level of function.  Patient was fitted with a [employer]MC neoprene to be able to work on the computer.  And wear as needed during the day.  Patient was provided with some modalities to do at home as well as active assisted range of motion for thumb palmar radial abduction.  Order assessed for custom splint next visit and if needed will initiate iontophoresis.  PERFORMANCE DEFICITS: in functional skills including ADLs, IADLs, ROM, strength, pain, flexibility, decreased knowledge of use of DME, and UE functional use,   and psychosocial skills including environmental adaptation and routines and behaviors.   IMPAIRMENTS: are limiting patient from ADLs, IADLs, rest and sleep, play, leisure, and social participation.   COMORBIDITIES: has no other co-morbidities that affects occupational performance. Patient will benefit from skilled OT to address above impairments and improve overall function.  MODIFICATION OR ASSISTANCE TO COMPLETE EVALUATION: No modification of tasks or assist necessary to complete an evaluation.  OT OCCUPATIONAL PROFILE AND HISTORY: Problem focused assessment: Including review of records relating to presenting problem.  CLINICAL DECISION MAKING: LOW - limited treatment options, no task modification necessary  REHAB  POTENTIAL: Good for goals  EVALUATION COMPLEXITY: Low     PLAN:  OT FREQUENCY: 1-2x/week  OT DURATION: 6 weeks  PLANNED INTERVENTIONS: 97168 OT Re-evaluation, 97535 self care/ADL training, 02889 therapeutic exercise, 97530 therapeutic activity, 97140 manual therapy, 97035 ultrasound, 97018 paraffin, 02960 fluidotherapy, 97034 contrast bath, 97033 iontophoresis, 97760 Orthotic Initial, [id number] Orthotic/Prosthetic subsequent, manual lymph drainage, passive range of motion, coping strategies training, patient/family education, and DME and/or AE instructions    CONSULTED AND AGREED WITH PLAN OF CARE: Patient    Ancel Peters, OTR/L,CLT 01/06/2024, 3:56 PM

## 2024-01-16 ENCOUNTER — Ambulatory Visit: Admitting: Occupational Therapy

## 2024-01-21 ENCOUNTER — Encounter: Admitting: Occupational Therapy

## 2024-01-23 ENCOUNTER — Ambulatory Visit: Attending: Orthopedic Surgery | Admitting: Occupational Therapy

## 2024-01-23 DIAGNOSIS — M25531 Pain in right wrist: Secondary | ICD-10-CM | POA: Diagnosis present

## 2024-01-23 DIAGNOSIS — M654 Radial styloid tenosynovitis [de Quervain]: Secondary | ICD-10-CM | POA: Diagnosis present

## 2024-01-23 DIAGNOSIS — M25641 Stiffness of right hand, not elsewhere classified: Secondary | ICD-10-CM | POA: Diagnosis present

## 2024-01-23 DIAGNOSIS — M6281 Muscle weakness (generalized): Secondary | ICD-10-CM | POA: Insufficient documentation

## 2024-01-23 NOTE — Therapy (Signed)
 OUTPATIENT OCCUPATIONAL THERAPY ORTHO TREATMENT  Patient Name: Margaret May MRN: [id number] DOB:18-Sep-1975, 48 y.o., female Today's Date: 01/23/2024  PCP: Dr Verdon MART PROVIDER: Dr Kathlynn  END OF SESSION:  OT End of Session - 01/23/24 1210     Visit Number 5    Number of Visits 10    Date for OT Re-Evaluation 02/04/24    OT Start Time 1209    OT Stop Time 1301    OT Time Calculation (min) 52 min    Activity Tolerance Patient tolerated treatment well    Behavior During Therapy [facility] for tasks assessed/performed          Past Medical History:  Diagnosis Date   GERD (gastroesophageal reflux disease)    Hypertension    Past Surgical History:  Procedure Laterality Date   LEEP  2015   UPPER GI ENDOSCOPY  09/2014   Patient Active Problem List   Diagnosis Date Noted   Allergic rhinitis 04/16/2017   Left wrist pain 04/16/2017   Right knee pain 01/14/2017   Restless leg syndrome 01/14/2017   Hemorrhoid 11/03/2015   Palpitations 03/15/2015   Benign essential HTN 01/26/2015   Benign paroxysmal positional vertigo 09/26/2014   GERD (gastroesophageal reflux disease) 09/26/2014   Anxiety state 09/26/2014    ONSET DATE: 6 months   REFERRING DIAG: R DeQuervain tenosynovitis  THERAPY DIAG:  Pain in right wrist  Stiffness of right hand, not elsewhere classified  Muscle weakness (generalized)  Radial styloid tenosynovitis  Rationale for Evaluation and Treatment: Rehabilitation  SUBJECTIVE:   SUBJECTIVE STATEMENT: It is better.  There are some days that I do not feel the pain.  It just depends on what I do.  I feel like I have more motion of my thumb. 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 A corticosteroid injection in January provided temporary relief, but symptoms returned three  weeks ago. The condition is not severe enough for surgery, and she prefers conservative management. Discomfort occurs during activities like opening jars and showering, with occasional burning sensations. Hand therapy is recommended to avoid surgery, offering relief through localized treatments and custom splint fabrication. Refer to hand therapy for these treatments and splint support, especially at night. Continue acetaminophen for pain management due to kidney-related contraindications for anti-inflammatory medications. Educate on avoiding topical corticosteroids as they are ineffective and may harm the skin. Diagnoses and all orders for this visit:  De Quervain's tenosynovitis - Ambulatory Referral to Occupational Therapy  This visit was coded based on medical decision making (MDM).  Dr Kathlynn visit:   PRECAUTIONS: None     WEIGHT BEARING RESTRICTIONS: No  PAIN:  Are you having pain? 1-2/10 radial wrist tenderness   FALLS: Has patient fallen in last 6 months? No  LIVING ENVIRONMENT: Lives with: lives with their family and lives alone  PLOF: Independent-work for [employer] from home on computer.  Is mostly on her phone and watching TV.  Has a 60-year-old niece that she sees every week  PATIENT GOALS: I just want the pain better.  NEXT MD VISIT: 4 weeks after OT  OBJECTIVE:  Note: Objective measures were completed at Evaluation unless otherwise noted.  HAND DOMINANCE: Right  ADLs: Patient has increased pain and difficulty with picking up objects.  Twisting or opening jars, writing, picking up a cup or bottle.  Opening packages, applying deodorant or wiping  during bathroom use.  Pushing of socks or pants and pulling up.  Using her cell phone ,doing laundry  FUNCTIONAL OUTCOME MEASURES: Assess next session  UPPER EXTREMITY ROM:     Active ROM Right eval Left eval R 12/19/23 R 01/23/24  Shoulder flexion      Shoulder abduction      Shoulder adduction      Shoulder extension       Shoulder internal rotation      Shoulder external rotation      Elbow flexion      Elbow extension      Wrist flexion 90 pain with lat pinch   90 90  Wrist extension 68  70 70  Wrist ulnar deviation 20 pain  30 30 FInkelstein no pain - tight   Wrist radial deviation 18  15 20   Wrist pronation      Wrist supination      (Blank rows = not tested)  Active ROM Right eval Left eval R 12/19/23 R 01/23/24  Thumb MCP (0-60)      Thumb IP (0-80)      Thumb Radial abd/add (0-55) 40   40 45  Thumb Palmar abd/add (0-45) 34   50 50  Thumb Opposition to Small Finger Pain with opposition to base of fifth   Opposition to fifth pain-free slide down second fold pain Opposition to [facility] pain free   Index MCP (0-90)       Index PIP (0-100)       Index DIP (0-70)        Long MCP (0-90)        Long PIP (0-100)        Long DIP (0-70)        Ring MCP (0-90)        Ring PIP (0-100)        Ring DIP (0-70)        Little MCP (0-90)        Little PIP (0-100)        Little DIP (0-70)        (Blank rows = not tested)  ested)  HAND FUNCTION: Grip strength: Right: 44 lbs; Left: 46 lbs, Lateral pinch: Right: 10 lbs, Left: 10 lbs, and 3 point pinch: Right: 8 lbs, Left: 7 lbs discomfort and pain at base of thumb regular wrist 01/23/24 Grip strength: Right: 46 lbs; Left: 42 lbs, Lateral pinch: Right: 12 lbs, Left: 11 lbs, and 3 point pinch: Right: 11 lbs, Left: 8 lbs  pain free    COORDINATION: Limited by stiffness in the thumb and pain  SENSATION: Denies any sensory issues  EDEMA: Radial wrist is a not  COGNITION: Overall cognitive status: Within functional limits for tasks assessed      TREATMENT DATE: 7/17//25        Great progress in active range of motion pain-free for right wrist in all planes. Pain-free manual muscle testing 5 -/5 Still some tightness over the radial wrist with [facility] test but no pain Thumb palmar abduction 50 degrees Radial abduction improved to 45. Patient  does better with stabilizing with thumb in waiving hand in ulnar deviation-improved to 45 degrees in the clinic can do 50 degrees. Opposition to distal palmar crease pain-free Grip and prevention strength assessed.   See flowsheet  Modalities: Contrast bath:  Time: 8 min Location: Right hand and wrist Decreased pain and inflammation and prior to review of home exercises for soft tissue and AAROM  Patient to do contrast to 2-3 times a day followed by soft tissue massage to webspace. Soft tissue massage done to webspace -patient to continue with Also metacarpal spreads and carpal spreads Followed by active assisted range of motion for thumb palmar  pain-free 10 reps Several times during the day patient to do stabilize thumb with ulnar deviation of the wrist away from the thumb.  10 reps hold 5 seconds-needed verbal cueing and min assist to focus on endrange slow and controlled Continue with asymmetric strengthening with thumb at side of palm pain-free 10 hold 3 seconds Reviewed with patient several times.  Several times during day- pt work on computer and hold mouse Patient to do several times during the day ice massage over the first dorsal compartment  Patient unable to wear her thumb spica splint too bulky and large. Patient was fitted with a [employer]MC neoprene at evaluation that appeared to have some relief of pain with her activities And patient can use it on the computer. Patient educated on use of it donning and doffing wearing.  Patient reports doing modification  to avoid any lateral pinch or picking up objects with the thumb.  Patient to use her palms and forearms to carry and lift things As well as to enlarge objects or cylinder grips.   Iontophoresis with dexamethazone  Medium patch on first dorsal compartment on the right 2.0 current 19 minutes Tolerate well - pt to  keep on for hour today afterwards      PATIENT EDUCATION: Education details: findings of eval and HEP  Person educated: Patient Education method: Explanation, Demonstration, Tactile cues, Verbal cues, and Handouts Education comprehension: verbalized understanding, returned demonstration, verbal cues required, and needs further education    GOALS: Goals reviewed with patient? Yes   LONG TERM GOALS: Target date: 8 wks   Patient to be independent in home program to verbalize 3-4 modifications to decrease pain Baseline: No knowledge of modifications or activities that causes the tendinitis Goal status: INITIAL  2.  Patient to be independent in home program using modalities, correct splint as well as home exercises to decrease pain and increased thumb palmar radial abduction Baseline: Patient with decreased thumb palmar radial abduction 34 to 40 degrees.  With 5/10 pain with thumb range of motion as well as wrist flexion and ulnar deviation Goal status: INITIAL  3.  Pain and tenderness decrease to less than 2/10 over first dorsal compartment for patient to show wrist and thumb active range of motion within normal limits performing ADLs and IADLs Baseline: Pain 5/10 with range of motion as well as tenderness of the first dorsal compartment that can increase.  With decreased thumb radial and palmar abduction with pain as well as ulnar deviation, radial deviation Goal status: INITIAL  4.  Right grip strength increased with more than 5 pounds as well as lateral pinch be pain-free and symptom-free for patient to report decreased pain with bathing and dressing and toileting and eating and grooming. Baseline: Right hand 44 left hand 46 pounds, lateral pinch 10 pounds on left and right.  And 3-point pinch on the right 8 pounds left 7 pounds with thumb pain over the first dorsal compartment-patient report pain with bathing, dressing, toileting, ADLs and applying deodorant Goal status:  INITIAL   ASSESSMENT:  CLINICAL IMPRESSION: Patient seen for occupational  therapy  for right dominant hand radial tenosynovitis.  Patient had a shot in January but was symptom-free only for 3 weeks.  Patient with prefab thumb spica but reports unable to wear because of bulkiness and too large.  Patient's pain with active range of motion of thumb and wrist is 5/10 pain.  Patient with tenderness over first dorsal compartment as well as ulnar radial deviation.  Patient with decreased thumb palmar radial abduction as well as increased pain.  Patient limiting and functional use of right dominant hand in ADLs and IADLs.  Patient arrives this date with decreased pain and tenderness over the first dorsal compartment as well as Terrilee.  Patient showing progress since last time in radial abduction of the thumb.  Great progress and pain-free active range of motion as well as grip and prevention strength.  Patient to continue with home exercises involving stabilize thumb with the ulnar deviation of the wrist away from thumb as well as isometric strengthening to the thumb at the wrist against the palm.  Pain-free.  Patient can benefit from skilled OT services to decrease pain increase motion increase strength to return to prior level of function.  Patient was fitted with a [employer] neoprene to be able to work on the computer.  And wear as needed during the day.  Patient was provided with some modalities to do at home as well as active assisted range of motion for thumb palmar radial abduction.  Order assessed for custom splint next visit and if needed will initiate iontophoresis.  PERFORMANCE DEFICITS: in functional skills including ADLs, IADLs, ROM, strength, pain, flexibility, decreased knowledge of use of DME, and UE functional use,   and psychosocial skills including environmental adaptation and routines and behaviors.   IMPAIRMENTS: are limiting patient from ADLs, IADLs, rest and sleep, play, leisure, and social  participation.   COMORBIDITIES: has no other co-morbidities that affects occupational performance. Patient will benefit from skilled OT to address above impairments and improve overall function.  MODIFICATION OR ASSISTANCE TO COMPLETE EVALUATION: No modification of tasks or assist necessary to complete an evaluation.  OT OCCUPATIONAL PROFILE AND HISTORY: Problem focused assessment: Including review of records relating to presenting problem.  CLINICAL DECISION MAKING: LOW - limited treatment options, no task modification necessary  REHAB POTENTIAL: Good for goals  EVALUATION COMPLEXITY: Low     PLAN:  OT FREQUENCY: 1-2x/week  OT DURATION: 6 weeks  PLANNED INTERVENTIONS: 97168 OT Re-evaluation, 97535 self care/ADL training, 02889 therapeutic exercise, 97530 therapeutic activity, 97140 manual therapy, 97035 ultrasound, 97018 paraffin, 02960 fluidotherapy, 97034 contrast bath, 97033 iontophoresis, 97760 Orthotic Initial, [id number] Orthotic/Prosthetic subsequent, manual lymph drainage, passive range of motion, coping strategies training, patient/family education, and DME and/or AE instructions    CONSULTED AND AGREED WITH PLAN OF CARE: Patient    Ancel Peters, OTR/L,CLT 01/23/2024, 12:48 PM

## 2024-01-30 ENCOUNTER — Ambulatory Visit: Admitting: Occupational Therapy

## 2024-02-06 ENCOUNTER — Ambulatory Visit: Admitting: Occupational Therapy

## 2024-02-06 DIAGNOSIS — M654 Radial styloid tenosynovitis [de Quervain]: Secondary | ICD-10-CM

## 2024-02-06 DIAGNOSIS — M25531 Pain in right wrist: Secondary | ICD-10-CM | POA: Diagnosis not present

## 2024-02-06 DIAGNOSIS — M25641 Stiffness of right hand, not elsewhere classified: Secondary | ICD-10-CM

## 2024-02-06 DIAGNOSIS — M6281 Muscle weakness (generalized): Secondary | ICD-10-CM

## 2024-02-06 NOTE — Therapy (Signed)
 OUTPATIENT OCCUPATIONAL THERAPY ORTHO TREATMENT/RECERT  Patient Name: Margaret May MRN: [id number] DOB:[birth year], 48 y.o., female Today's Date: 02/06/2024  PCP: Dr Verdon MART PROVIDER: Dr Kathlynn  END OF SESSION:  OT End of Session - 02/06/24 1315     Visit Number 6    Number of Visits 10    Date for OT Re-Evaluation 03/18/24    OT Start Time 1315    Activity Tolerance Patient tolerated treatment well    Behavior During Therapy [facility] for tasks assessed/performed          Past Medical History:  Diagnosis Date   GERD (gastroesophageal reflux disease)    Hypertension    Past Surgical History:  Procedure Laterality Date   LEEP  2015   UPPER GI ENDOSCOPY  09/2014   Patient Active Problem List   Diagnosis Date Noted   Allergic rhinitis 04/16/2017   Left wrist pain 04/16/2017   Right knee pain 01/14/2017   Restless leg syndrome 01/14/2017   Hemorrhoid 11/03/2015   Palpitations 03/15/2015   Benign essential HTN 01/26/2015   Benign paroxysmal positional vertigo 09/26/2014   GERD (gastroesophageal reflux disease) 09/26/2014   Anxiety state 09/26/2014    ONSET DATE: 6 months   REFERRING DIAG: R DeQuervain tenosynovitis  THERAPY DIAG:  Pain in right wrist  Stiffness of right hand, not elsewhere classified  Muscle weakness (generalized)  Radial styloid tenosynovitis  Rationale for Evaluation and Treatment: Rehabilitation  SUBJECTIVE:   SUBJECTIVE STATEMENT: I am doing much better.  I do not feel pain with any daily activities.  But with certain twisting and moving activities randomly I feel pain.  Like we were in [place]  and I did patient go over and did some funky movement and had pain.  My thumb still did not want to come up as high as my left. 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 A corticosteroid injection in January provided temporary relief, but symptoms returned three weeks ago. The condition is not severe enough for surgery, and she prefers conservative management. Discomfort occurs during activities like opening jars and showering, with occasional burning sensations. Hand therapy is recommended to avoid surgery, offering relief through localized treatments and custom splint fabrication. Refer to hand therapy for these treatments and splint support, especially at night. Continue acetaminophen for pain management due to kidney-related contraindications for anti-inflammatory medications. Educate on avoiding topical corticosteroids as they are ineffective and may harm the skin. Diagnoses and all orders for this visit:  De Quervain's tenosynovitis - Ambulatory Referral to Occupational Therapy  This visit was coded based on medical decision making (MDM).  Dr Kathlynn visit:   PRECAUTIONS: None     WEIGHT BEARING RESTRICTIONS: No  PAIN:  Are you having pain? 1-2/10 radial wrist tenderness   FALLS: Has patient fallen in last 6 months? No  LIVING ENVIRONMENT: Lives with: lives with their family and lives alone  PLOF: Independent-work for [employer] from home on computer.  Is mostly on her phone and watching TV.  Has a 9-year-old niece that she sees every week  PATIENT GOALS: I just want the pain better.  NEXT MD VISIT: 4 weeks after OT  OBJECTIVE:  Note: Objective measures were completed at Evaluation unless otherwise noted.  HAND DOMINANCE: Right  ADLs: Patient has increased pain and difficulty with picking up objects.  Twisting or opening jars, writing,  picking up a cup or bottle.  Opening packages, applying deodorant or wiping during bathroom use.  Pushing of socks or pants and pulling up.  Using her cell phone ,doing laundry  FUNCTIONAL OUTCOME MEASURES: Assess next session  UPPER EXTREMITY ROM:     Active ROM Right eval Left eval R 12/19/23  R 01/23/24  Shoulder flexion      Shoulder abduction      Shoulder adduction      Shoulder extension      Shoulder internal rotation      Shoulder external rotation      Elbow flexion      Elbow extension      Wrist flexion 90 pain with lat pinch   90 90  Wrist extension 68  70 70  Wrist ulnar deviation 20 pain  30 30 FInkelstein no pain - tight   Wrist radial deviation 18  15 20   Wrist pronation      Wrist supination      (Blank rows = not tested)  Active ROM Right eval Left eval R 12/19/23 R 01/23/24  Thumb MCP (0-60)      Thumb IP (0-80)      Thumb Radial abd/add (0-55) 40   40 45  Thumb Palmar abd/add (0-45) 34   50 50  Thumb Opposition to Small Finger Pain with opposition to base of fifth   Opposition to fifth pain-free slide down second fold pain Opposition to [facility] pain free   Index MCP (0-90)       Index PIP (0-100)       Index DIP (0-70)        Long MCP (0-90)        Long PIP (0-100)        Long DIP (0-70)        Ring MCP (0-90)        Ring PIP (0-100)        Ring DIP (0-70)        Little MCP (0-90)        Little PIP (0-100)        Little DIP (0-70)        (Blank rows = not tested)  ested)  HAND FUNCTION: Grip strength: Right: 44 lbs; Left: 46 lbs, Lateral pinch: Right: 10 lbs, Left: 10 lbs, and 3 point pinch: Right: 8 lbs, Left: 7 lbs discomfort and pain at base of thumb regular wrist 01/23/24 Grip strength: Right: 46 lbs; Left: 42 lbs, Lateral pinch: Right: 12 lbs, Left: 11 lbs, and 3 point pinch: Right: 11 lbs, Left: 8 lbs  pain free  02/06/24 Grip strength: Right: 54 lbs; Left: 46 lbs, Lateral pinch: Right: 13 lbs, Left: 12 lbs, and 3 point pinch: Right: 15 lbs, Left: 8 lbs  pain free   COORDINATION: Limited by stiffness in the thumb and pain  SENSATION: Denies any sensory issues  EDEMA: Radial wrist is a not  COGNITION: Overall cognitive status: Within functional limits for tasks assessed      TREATMENT DATE: 7/31//25        Patient's right  wrist active range of motion of forearm within normal limits.  Strength improved greatly 5/5 in all planes except supination 5 -/5 Still some tightness over the radial wrist with [facility] test but no pain Thumb palmar abduction 50 degrees Radial abduction continues to be 45. Patient does better with stabilizing with thumb in waiving hand in ulnar deviation-can perform 50 degrees of radial abduction with his  home exercise Opposition to distal palmar crease pain-free Grip and prevention strength assessed.  Continue to show progress  See flowsheet                                                                                                                    Modalities: Contrast bath:  Time: 8 min Location: Right hand and wrist Decreased pain and inflammation and prior to review of home exercises for soft tissue and AAROM  Patient to do contrast to 2-3 times a day followed by soft tissue massage to webspace. Soft tissue massage done to webspace -patient to continue with Also metacarpal spreads and carpal spreads Followed by active assisted range of motion for thumb palmar  pain-free 10 reps Add rubber band for palmar abduction placing hold 12 reps 2-3 times a day  Several times during the day Perform soft tissue mobilization to thumb webspace Followed by thumb stabilization with ulnar deviation of the wrist away from the thumb.  10 reps hold 5 seconds-slow and controlled Followed by isometric strengthening with thumb at side of palm pain-free 10 hold 3 seconds Reviewed with patient several times. Patient works on the computer and the right hand is a lot of time on the mouse in thumb adduction  Several times during day- pt work on computer and hold mouse    Patient reports doing modification  to avoid any lateral pinch or picking up objects with the thumb.  Patient to use her palms and forearms to carry and lift things As well as to enlarge objects or cylinder grips.    Iontophoresis with dexamethazone  Medium patch on first dorsal compartment on the right 2.0 current 19 minutes Tolerate well - pt to keep on for hour today afterwards      PATIENT EDUCATION: Education details: findings of eval and HEP  Person educated: Patient Education method: Explanation, Demonstration, Tactile cues, Verbal cues, and Handouts Education comprehension: verbalized understanding, returned demonstration, verbal cues required, and needs further education    GOALS: Goals reviewed with patient? Yes   LONG TERM GOALS: Target date: 8 wks   Patient to be independent in home program to verbalize 3-4 modifications to decrease pain Baseline: No knowledge of modifications or activities that causes the tendinitis Goal status: Met  2.  Patient to be independent in home program using modalities, correct splint as well as home exercises to decrease pain and increased thumb palmar radial abduction Baseline: Patient with decreased thumb palmar radial abduction 34 to 40 degrees.  With 5/10 pain with thumb range of motion as well as wrist flexion and ulnar deviation NOW pain improved greatly to 1-2/10 at the most with tenderness over the distal radius head.  Active range of motion improved greatly patient still limited in radial abduction. Goal status: Progressing  3.  Pain and tenderness decrease to less than 2/10 over first dorsal compartment for patient to show wrist and thumb active range of motion within normal limits performing ADLs and IADLs Baseline: Pain 5/10 with  range of motion as well as tenderness of the first dorsal compartment that can increase.  With decreased thumb radial and palmar abduction with pain as well as ulnar deviation, radial deviation Goal status: Met  4.  Right grip strength increased with more than 5 pounds as well as lateral pinch be pain-free and symptom-free for patient to report decreased pain with bathing and dressing and toileting and eating and  grooming. Baseline: Right hand 44 left hand 46 pounds, lateral pinch 10 pounds on left and right.  And 3-point pinch on the right 8 pounds left 7 pounds with thumb pain over the first dorsal compartment-patient report pain with bathing, dressing, toileting, ADLs and applying deodorant Goal status: Met  ASSESSMENT:  CLINICAL IMPRESSION: Patient seen for occupational therapy  for right dominant hand radial tenosynovitis.  Patient had a shot in January but was symptom-free only for 3 weeks.  Patient with prefab thumb spica but reports unable to wear because of bulkiness and too large.  Patient's pain with active range of motion of thumb and wrist is 5/10 pain.  Patient with tenderness over first dorsal compartment as well as ulnar radial deviation.  Patient with decreased thumb palmar radial abduction as well as increased pain.  Patient limiting and functional use of right dominant hand in ADLs and IADLs.   NOW patient made great progress in pain 1-2/10 at the most with tenderness over the distal radius.  Patient negative Terrilee but still with some stiffness tightness into ulnar deviation.  Strength in wrist and forearm improved greatly 5/5 except supination.  Opposition pain-free within normal limits.  Patient continues to be limited in radial abduction of thumb more than palmar abduction and decreased strength.  Patient continue with home exercises  involving stabilize thumb with the ulnar deviation of the wrist away from thumb as well as isometric strengthening -able to perform ADLs pain-free.  Patient can benefit from skilled OT services to decrease pain increase motion increase strength in thumb radial and palmar abduction to return to prior level of function.  Patient continues to wear a [facility] neoprene as needed. .  Patient was provided with some modalities to do at home as well as active assisted range of motion for thumb palmar radial abduction.  Order assessed for custom splint next visit and if  needed will initiate iontophoresis.  PERFORMANCE DEFICITS: in functional skills including ADLs, IADLs, ROM, strength, pain, flexibility, decreased knowledge of use of DME, and UE functional use,   and psychosocial skills including environmental adaptation and routines and behaviors.   IMPAIRMENTS: are limiting patient from ADLs, IADLs, rest and sleep, play, leisure, and social participation.   COMORBIDITIES: has no other co-morbidities that affects occupational performance. Patient will benefit from skilled OT to address above impairments and improve overall function.  MODIFICATION OR ASSISTANCE TO COMPLETE EVALUATION: No modification of tasks or assist necessary to complete an evaluation.  OT OCCUPATIONAL PROFILE AND HISTORY: Problem focused assessment: Including review of records relating to presenting problem.  CLINICAL DECISION MAKING: LOW - limited treatment options, no task modification necessary  REHAB POTENTIAL: Good for goals  EVALUATION COMPLEXITY: Low     PLAN:  OT FREQUENCY: Once a week to biweekly  OT DURATION: 6 weeks  PLANNED INTERVENTIONS: 97168 OT Re-evaluation, 97535 self care/ADL training, 02889 therapeutic exercise, 97530 therapeutic activity, 97140 manual therapy, 97035 ultrasound, 97018 paraffin, 02960 fluidotherapy, 97034 contrast bath, 97033 iontophoresis, 97760 Orthotic Initial, [id number] Orthotic/Prosthetic subsequent, manual lymph drainage, passive range of motion, coping strategies training,  patient/family education, and DME and/or AE instructions    CONSULTED AND AGREED WITH PLAN OF CARE: Patient    Ancel Peters, OTR/L,CLT 02/06/2024, 1:16 PM

## 2024-04-28 NOTE — Progress Notes (Signed)
 " Chief Complaint:   Chief Complaint  Patient presents with   Annual Exam    CPE    Subjective:   Margaret May is a 48 y.o. female in today for recheck and health maintenance issues. 1.  HTN.  She is on Amlodipine  10 mg daily.  BP usually runs around 130-140's/80-90's.  I started her on Losartan/HCT a couple months ago but she never took it, saying insurance wouldn't cover it.  She is not physically active.  Has no chest pain, SOB, dizziness or swelling in her legs.   2.  Allergic rhinitis.  She uses Flonase and sinus medication as needed.  Symptoms are generally controlled. 3.  Low Vit D.  She was on Vit D 50,000 units.  She stopped it months ago after last Vit D level was 104 on 11/20/23. 4.  Health maintenance issues. Is up to date with tetanus.  Has had Covid shots x 2 in April 2021, none since.  She sees Gynecology.  Has a history of of ASCUS and HPV.  Had a LEEP procedure in 2017 and colposcopy in 2023, which was normal.  Last Pap on 06/11/23 was normal, with negative HPV.  She had a mammogram 06/17/23.  No history of abnormal mammograms and no breast complaints.  No urinary complaints such as dysuria, increased frequency, incontinence.  She had colonoscopy 12/29/21 which was normal.  Does little to no aerobic activity.  Has no history of smoking.  Has alcohol occasionally.  Family history is positive for HTN, negative for coronary artery disease, stroke, diabetes, colon or breast cancer.  She sees [employer] yearly.  ROS:  Per HPI.  Has occasional heartburn, typically with tomato sauce or spicy food.  Uses Nexium as needed, perhaps 1-2 times a month.  Everything else is negative under all systems reviewed.  Patient Active Problem List  Diagnosis   Allergic rhinitis   Benign essential HTN   GERD (gastroesophageal reflux disease)   Benign paroxysmal positional vertigo   Restless leg syndrome   Anxiety state   Hemorrhoid   Left wrist pain   Palpitations   Right knee  pain   Vitamin D deficiency   Elevated hemoglobin A1c   Dysplasia of cervix uteri, unspecified   Residual hemorrhoidal skin tags   Hypercalcemia    Outpatient Medications Prior to Visit  Medication Sig Dispense Refill   amLODIPine  (NORVASC ) 10 MG tablet TAKE 1 TABLET(10 MG) BY MOUTH DAILY 90 tablet 1   cetirizine (ZYRTEC) 10 MG tablet Take 1 tablet (10 mg total) by mouth once daily as needed for Allergies 30 tablet 1   ergocalciferol, vitamin D2, 1,250 mcg (50,000 unit) capsule TAKE ONE CAPSULE BY MOUTH ONCE WEEKLY 12 capsule 1   estradiol (DOTTI) patch 0.1 mg/24 hr Place 1 patch onto the skin twice a week 8 patch 11   fluticasone propionate (FLONASE) 50 mcg/actuation nasal spray SHAKE LIQUID AND USE 2 SPRAYS IN EACH NOSTRIL DAILY AS NEEDED FOR RHINITIS 48 g 0   progesterone (PROMETRIUM) 200 MG capsule Take 1 capsule (200 mg total) by mouth once daily 30 capsule 11   triamcinolone 0.1 % cream Apply topically 2 (two) times daily Apply to affected areas 2 times per day as need (may use for 7-10 days, then take 7 days off, may repeat as needed 30 g 0   losartan-hydroCHLOROthiazide  (HYZAAR) 50-12.5 mg tablet Start 1/2 tab a day. (Patient not taking: Reported on 04/28/2024) 30 tablet 0   No facility-administered medications prior  to visit.    Objective:   Vitals:   04/28/24 1336  BP: (!) 144/96  Pulse: 80  SpO2: 100%  Weight: 66.1 kg (145 lb 12.8 oz)  Height: 157.5 cm (5' 2)  PainSc: 0-No pain   Body mass index is 26.67 kg/m.   GENERAL:  Patient is well-developed, well-nourished female, alert, oriented, in no acute distress.  Affect is normal. HEENT:  Normocephalic, atraumatic.  Conjunctivae non-injected.  Sclerae non-icteric. Extraocular movements are intact. Pupils equal, round, and reactive to light.  Tympanic membranes obscured by cerumen bilaterally.  Oropharynx:  Moist mucous membranes.  No tonsillar enlargement, erythema, exudate, or lesions. NECK:  No adenopathy,  thyromegaly, JVD, or carotid bruits. RESPIRATORY:  Normal inspiratory and expiratory effort.  Lungs are clear to auscultation bilaterally.   CARDIOVASCULAR:  Regular rate and rhythm.  S1, S2 without murmur, rub, or gallop. BREASTS:  Deferred to Gyn ABDOMEN:  Nontender, nondistended.  Positive bowel sounds.  No organomegaly. GU:  Deferred to Gyn. EXTREMITIES:  +2 pulses bilaterally.  No cyanosis, clubbing, or edema. SKIN:  Warm and dry with no rashes or nail changes.  Deferred to Dermatology. NEURO:  Cranial nerves II-XII are intact.  Motor strength is 5/5 with normal tone in the upper and lower extremities bilaterally.  Reflexes are 2+ throughout and symmetric.  Sensation is intact to light touch throughout.     PHQ 2/9 from today's flowsheet  PHQ-2 PHQ-2 Over the last 2 weeks, how often have you been bothered by any of the following problems? Little interest or pleasure in doing things: Not at all Feeling down, depressed, or hopeless: Not at all Patient Health Questionnaire-2 Score: 0  PHQ-9 (if PHQ >=3)    Depression Severity and Treatment Recommendations:  0-4= None  5-9= Mild / Treatment: Support, educate to call if worse; return in one month  10-14= Moderate / Treatment: Support, watchful waiting; Antidepressant or Psychotherapy  15-19= Moderately severe / Treatment: Antidepressant OR Psychotherapy  >= 20 = Major depression, severe / Antidepressant AND Psychotherapy   Goals      Maintain health/healthy lifestyle       Assessment/Plan:  48 year old female here for follow up and health maintenance issues. 1.  HTN.  Start Losartan/hematocrit 1/2 tab daily; stay on Amlodipine  10 mg daily.  Monitor BP and send me readings in 2-3 weeks.  Repeat Met B and CBC in a month or so. 2.  Allergic rhinitis.  Stay on Flonase and allergy/sinus medication as needed. 3.  Heartburn/reflux.  Use Nexium as needed.   4.  History of low Vit D.  Check Vit D level next month.  Stay off  supplementation until then. 5.  History of elevated A1c.  Repeat A1c and Met B next labs. 6.  Health maintenance issues.  Is up to date with tetanus.  Consider flu and Covid shots this fall.  Follow up with Gynecology for pelvic and breast exams next spring.  She doesn't need repeat colonoscopy until 2033.   Encouraged healthy lifestyle, with increased aerobic activity, low fat diet, no smoking and little or no alcohol use. Follow up for eye exam with Dr Odetta.  She will set up appt with Dermatology for a complete skin check. 7.  Follow up in 6 months for recheck, sooner as needed for acute concerns.   Alm Na, MD, PhD  "

## 2024-07-08 ENCOUNTER — Encounter: Payer: Self-pay | Admitting: Emergency Medicine

## 2024-07-08 ENCOUNTER — Emergency Department
Admission: EM | Admit: 2024-07-08 | Discharge: 2024-07-08 | Disposition: A | Discharge status: Home / Self Care | Attending: Emergency Medicine | Admitting: Emergency Medicine

## 2024-07-08 ENCOUNTER — Other Ambulatory Visit: Payer: Self-pay

## 2024-07-08 ENCOUNTER — Emergency Department

## 2024-07-08 DIAGNOSIS — D219 Benign neoplasm of connective and other soft tissue, unspecified: Secondary | ICD-10-CM

## 2024-07-08 DIAGNOSIS — E876 Hypokalemia: Secondary | ICD-10-CM | POA: Insufficient documentation

## 2024-07-08 DIAGNOSIS — R1030 Lower abdominal pain, unspecified: Secondary | ICD-10-CM | POA: Diagnosis present

## 2024-07-08 DIAGNOSIS — I10 Essential (primary) hypertension: Secondary | ICD-10-CM | POA: Insufficient documentation

## 2024-07-08 DIAGNOSIS — M545 Low back pain, unspecified: Secondary | ICD-10-CM | POA: Diagnosis not present

## 2024-07-08 DIAGNOSIS — N939 Abnormal uterine and vaginal bleeding, unspecified: Secondary | ICD-10-CM

## 2024-07-08 DIAGNOSIS — D259 Leiomyoma of uterus, unspecified: Secondary | ICD-10-CM | POA: Insufficient documentation

## 2024-07-08 LAB — CBC WITH DIFFERENTIAL/PLATELET
Abs Immature Granulocytes: 0.02 K/uL (ref 0.00–0.07)
Basophils Absolute: 0 K/uL (ref 0.0–0.1)
Basophils Relative: 0 %
Eosinophils Absolute: 0.1 K/uL (ref 0.0–0.5)
Eosinophils Relative: 1 %
HCT: 44.4 % (ref 36.0–46.0)
Hemoglobin: 14.5 g/dL (ref 12.0–15.0)
Immature Granulocytes: 0 %
Lymphocytes Relative: 35 %
Lymphs Abs: 2.4 K/uL (ref 0.7–4.0)
MCH: 25.6 pg — ABNORMAL LOW (ref 26.0–34.0)
MCHC: 32.7 g/dL (ref 30.0–36.0)
MCV: 78.4 fL — ABNORMAL LOW (ref 80.0–100.0)
Monocytes Absolute: 0.3 K/uL (ref 0.1–1.0)
Monocytes Relative: 5 %
Neutro Abs: 4.1 K/uL (ref 1.7–7.7)
Neutrophils Relative %: 59 %
Platelets: 363 K/uL (ref 150–400)
RBC: 5.66 MIL/uL — ABNORMAL HIGH (ref 3.87–5.11)
RDW: 14.1 % (ref 11.5–15.5)
WBC: 7 K/uL (ref 4.0–10.5)
nRBC: 0 % (ref 0.0–0.2)

## 2024-07-08 LAB — COMPREHENSIVE METABOLIC PANEL WITH GFR
ALT: 11 U/L (ref 0–44)
AST: 24 U/L (ref 15–41)
Albumin: 4.6 g/dL (ref 3.5–5.0)
Alkaline Phosphatase: 104 U/L (ref 38–126)
Anion gap: 13 (ref 5–15)
BUN: 13 mg/dL (ref 6–20)
CO2: 25 mmol/L (ref 22–32)
Calcium: 9.7 mg/dL (ref 8.9–10.3)
Chloride: 101 mmol/L (ref 98–111)
Creatinine, Ser: 0.93 mg/dL (ref 0.44–1.00)
GFR, Estimated: >60 mL/min
Glucose, Bld: 98 mg/dL (ref 70–99)
Potassium: 3 mmol/L — ABNORMAL LOW (ref 3.5–5.1)
Sodium: 138 mmol/L (ref 135–145)
Total Bilirubin: 0.5 mg/dL (ref 0.0–1.2)
Total Protein: 8.3 g/dL — ABNORMAL HIGH (ref 6.5–8.1)

## 2024-07-08 LAB — URINALYSIS, ROUTINE W REFLEX MICROSCOPIC
Bilirubin Urine: NEGATIVE
Glucose, UA: NEGATIVE mg/dL
Ketones, ur: NEGATIVE mg/dL
Leukocytes,Ua: NEGATIVE
Nitrite: NEGATIVE
Protein, ur: 100 mg/dL — AB
Specific Gravity, Urine: 1.006 (ref 1.005–1.030)
pH: 7 (ref 5.0–8.0)

## 2024-07-08 LAB — LIPASE, BLOOD: Lipase: 30 U/L (ref 11–51)

## 2024-07-08 MED ORDER — POTASSIUM CHLORIDE CRYS ER 20 MEQ PO TBCR
40.0000 meq | EXTENDED_RELEASE_TABLET | Freq: Once | ORAL | Status: AC
  Administered 2024-07-08: 40 meq via ORAL
  Filled 2024-07-08: qty 2

## 2024-07-08 MED ORDER — IOHEXOL 300 MG/ML  SOLN
100.0000 mL | Freq: Once | INTRAMUSCULAR | Status: AC | PRN
  Administered 2024-07-08: 100 mL via INTRAVENOUS

## 2024-07-08 MED ORDER — POTASSIUM CHLORIDE CRYS ER 20 MEQ PO TBCR: 20.0000 meq | EXTENDED_RELEASE_TABLET | Freq: Every day | ORAL | 0 refills | Status: AC

## 2024-07-08 MED ORDER — KETOROLAC TROMETHAMINE 30 MG/ML IJ SOLN
30.0000 mg | Freq: Once | INTRAMUSCULAR | Status: AC
  Administered 2024-07-08: 30 mg via INTRAVENOUS
  Filled 2024-07-08: qty 1

## 2024-07-08 NOTE — Discharge Instructions (Addendum)
 Please keep your scheduled appointment with your gynecologist.  Follow-up with your primary care physician within the next few days.  Return to the emergency department for new or worsening symptoms.

## 2024-07-08 NOTE — ED Triage Notes (Signed)
 Patient ambulatory to triage with steady gait, without difficulty or distress noted; pt reports lower back pain began wk ago, then on Monday pain radiating around into abd with no accomp symptoms; st hx of same and dx with ?intestinal inflammation

## 2024-07-08 NOTE — ED Provider Notes (Signed)
 "  [facility] Provider Note    Event Date/Time   First MD Initiated Contact with Patient 07/08/24 [phone]     (approximate)   History   Back Pain   HPI  Margaret May is a 48 y.o. female with a past medical history of hypertension presenting to the emergency department complaining of lower back and lower abdominal pain.  Patient reports the pain began 2 days ago.  She reports that it started in her lower back and radiates around into her abdomen.  She reports that she has had symptoms like this in the past but does not recall what she was diagnosed with.  She was seen by her doctor when the symptoms first started and had a urinalysis that did not show any findings concerning for infection.  She also reports that for the last 2 weeks she has had intermittent vaginal bleeding for the first time in several years.  She does report that 3 weeks ago she was started on hormone therapy and thinks this is related.     Physical Exam   Triage Vital Signs: ED Triage Vitals  Encounter Vitals Group     BP 07/08/24 0628 (!) 193/126     Girls Systolic BP Percentile --      Girls Diastolic BP Percentile --      Boys Systolic BP Percentile --      Boys Diastolic BP Percentile --      Pulse Rate 07/08/24 0628 99     Resp 07/08/24 0628 16     Temp 07/08/24 0628 98.7 F (37.1 C)     Temp src --      SpO2 07/08/24 0628 99 %     Weight 07/08/24 0627 145 lb (65.8 kg)     Height 07/08/24 0627 5' 2 (1.575 m)     Head Circumference --      Peak Flow --      Pain Score 07/08/24 0627 5     Pain Loc --      Pain Education --      Exclude from Growth Chart --     Most recent vital signs: Vitals:   07/08/24 0628  BP: (!) 193/126  Pulse: 99  Resp: 16  Temp: 98.7 F (37.1 C)  SpO2: 99%     General: Awake, no distress.  CV:  Good peripheral perfusion.  Resp:  Normal effort.  Abd:  No distention.  Nontender to palpation, no flank tenderness Other:     ED  Results / Procedures / Treatments   Labs (all labs ordered are listed, but only abnormal results are displayed) Labs Reviewed  CBC WITH DIFFERENTIAL/PLATELET - Abnormal; Notable for the following components:      Result Value   RBC 5.66 (*)    MCV 78.4 (*)    MCH 25.6 (*)    All other components within normal limits  COMPREHENSIVE METABOLIC PANEL WITH GFR - Abnormal; Notable for the following components:   Potassium 3.0 (*)    Total Protein 8.3 (*)    All other components within normal limits  LIPASE, BLOOD  URINALYSIS, ROUTINE W REFLEX MICROSCOPIC  POC URINE PREG, ED     EKG     RADIOLOGY CT abdomen/pelvis: IMPRESSION: 1. Multiple uterine fibroids are noted, the largest measuring 6.4 cm. 2. No other abnormality seen in the abdomen or pelvis    PROCEDURES:  Critical Care performed: No  Procedures   MEDICATIONS ORDERED IN ED: Medications  ketorolac  (TORADOL ) 30 MG/ML injection 30 mg (has no administration in time range)     IMPRESSION / MDM / ASSESSMENT AND PLAN / ED COURSE  I reviewed the triage vital signs and the nursing notes.                              Differential diagnosis includes, but is not limited to, ovarian cyst, ovarian torsion, acute appendicitis, diverticulitis, urinary tract infection/pyelonephritis, endometriosis, bowel obstruction, colitis, renal colic, gastroenteritis, hernia, fibroids, endometriosis, pregnancy related pain including ectopic pregnancy, etc.   Patient's presentation is most consistent with acute illness / injury with system symptoms.  Patient is a 48 year old female with past medical history of hypertension presenting to the emergency department complaining of lower back pain radiating into her abdomen with intermittent vaginal bleeding.  Pain treated with Toradol .  Workup was performed including blood work and CT of the abdomen/pelvis.  Patient was noted to be hypokalemic with a potassium of 3.0.  Oral replacement  ordered.  Blood work is otherwise unremarkable.  CT imaging showed multiple uterine fibroids the largest of which was measuring 6.4 cm.  On reevaluation, the patient reports that she is feeling improved after treatment with Toradol .  I discussed lab and imaging results.  She reports that she has been told in the past that her potassium is low and also has been told in the past that she has uterine fibroids.  She reports that she has an appointment scheduled with her gynecologist on the 13th for further evaluation of her bleeding.  I advised her to keep this appointment as well as to follow-up with her primary care physician within the next few days.  I will prescribe her potassium pills for her hypokalemia.  She may take Tylenol/Motrin  as needed for pain control.  Return to the emergency department for new or worsening symptoms.     FINAL CLINICAL IMPRESSION(S) / ED DIAGNOSES   Final diagnoses:  Fibroids  Acute bilateral low back pain without sciatica  Vaginal bleeding  Hypokalemia     Rx / DC Orders   ED Discharge Orders          Ordered    potassium chloride  SA (KLOR-CON  M) 20 MEQ tablet  Daily        07/08/24 0941             Note:  This document was prepared using Dragon voice recognition software and may include unintentional dictation errors.   Rexford Reche HERO, MD 07/08/24 1008  "

## 2024-07-08 NOTE — ED Notes (Signed)
 Pt verbalizes understanding of discharge instructions. Opportunity for questioning and answers were provided. Pt discharged from ED to home.   ? ?

## 2024-07-08 NOTE — ED Notes (Signed)
"  Pt gone to CT  "
